# Patient Record
Sex: Female | Born: 1975 | Race: White | Hispanic: No | Marital: Married | Smoking: Former smoker
Health system: Southern US, Community
[De-identification: ages and names within clinical notes are randomized; demographics above are authoritative.]

## PROBLEM LIST (undated history)

## (undated) ENCOUNTER — Inpatient Hospital Stay (HOSPITAL_COMMUNITY): Payer: Self-pay

## (undated) DIAGNOSIS — D649 Anemia, unspecified: Secondary | ICD-10-CM

## (undated) DIAGNOSIS — G5720 Lesion of femoral nerve, unspecified lower limb: Secondary | ICD-10-CM

## (undated) DIAGNOSIS — B009 Herpesviral infection, unspecified: Secondary | ICD-10-CM

## (undated) DIAGNOSIS — R112 Nausea with vomiting, unspecified: Secondary | ICD-10-CM

## (undated) DIAGNOSIS — F419 Anxiety disorder, unspecified: Secondary | ICD-10-CM

## (undated) DIAGNOSIS — Z8742 Personal history of other diseases of the female genital tract: Secondary | ICD-10-CM

## (undated) DIAGNOSIS — N809 Endometriosis, unspecified: Secondary | ICD-10-CM

## (undated) DIAGNOSIS — IMO0002 Reserved for concepts with insufficient information to code with codable children: Secondary | ICD-10-CM

## (undated) DIAGNOSIS — Z9889 Other specified postprocedural states: Secondary | ICD-10-CM

## (undated) DIAGNOSIS — O039 Complete or unspecified spontaneous abortion without complication: Secondary | ICD-10-CM

## (undated) DIAGNOSIS — R87619 Unspecified abnormal cytological findings in specimens from cervix uteri: Secondary | ICD-10-CM

## (undated) HISTORY — DX: Herpesviral infection, unspecified: B00.9

## (undated) HISTORY — DX: Lesion of femoral nerve, unspecified lower limb: G57.20

## (undated) HISTORY — PX: WISDOM TOOTH EXTRACTION: SHX21

## (undated) HISTORY — DX: Personal history of other diseases of the female genital tract: Z87.42

## (undated) HISTORY — DX: Anemia, unspecified: D64.9

## (undated) HISTORY — DX: Complete or unspecified spontaneous abortion without complication: O03.9

## (undated) HISTORY — DX: Endometriosis, unspecified: N80.9

## (undated) HISTORY — PX: FOOT SURGERY: SHX648

## (undated) HISTORY — DX: Anxiety disorder, unspecified: F41.9

---

## 2001-09-25 ENCOUNTER — Other Ambulatory Visit: Admission: RE | Admit: 2001-09-25 | Discharge: 2001-09-25 | Payer: Self-pay | Admitting: Family Medicine

## 2002-06-25 HISTORY — PX: LAPAROSCOPY: SHX197

## 2004-07-04 ENCOUNTER — Ambulatory Visit (HOSPITAL_COMMUNITY): Admission: AD | Admit: 2004-07-04 | Discharge: 2004-07-04 | Payer: Self-pay | Admitting: Gynecology

## 2004-07-04 ENCOUNTER — Encounter (INDEPENDENT_AMBULATORY_CARE_PROVIDER_SITE_OTHER): Payer: Self-pay | Admitting: *Deleted

## 2005-06-25 HISTORY — PX: DILATION AND CURETTAGE OF UTERUS: SHX78

## 2006-09-16 ENCOUNTER — Encounter (INDEPENDENT_AMBULATORY_CARE_PROVIDER_SITE_OTHER): Payer: Self-pay | Admitting: Gynecology

## 2006-09-16 ENCOUNTER — Ambulatory Visit: Payer: Self-pay | Admitting: Gynecology

## 2007-04-16 ENCOUNTER — Ambulatory Visit: Payer: Self-pay | Admitting: Gynecology

## 2007-04-18 ENCOUNTER — Ambulatory Visit (HOSPITAL_COMMUNITY): Admission: RE | Admit: 2007-04-18 | Discharge: 2007-04-18 | Payer: Self-pay | Admitting: Gynecology

## 2007-05-05 ENCOUNTER — Ambulatory Visit: Payer: Self-pay | Admitting: Gynecology

## 2007-05-05 ENCOUNTER — Encounter (INDEPENDENT_AMBULATORY_CARE_PROVIDER_SITE_OTHER): Payer: Self-pay | Admitting: Gynecology

## 2007-05-28 ENCOUNTER — Ambulatory Visit (HOSPITAL_COMMUNITY): Admission: RE | Admit: 2007-05-28 | Discharge: 2007-05-28 | Payer: Self-pay | Admitting: Gynecology

## 2007-06-02 ENCOUNTER — Ambulatory Visit: Payer: Self-pay | Admitting: Gynecology

## 2007-06-26 DIAGNOSIS — G5720 Lesion of femoral nerve, unspecified lower limb: Secondary | ICD-10-CM

## 2007-06-26 HISTORY — DX: Lesion of femoral nerve, unspecified lower limb: G57.20

## 2007-07-02 ENCOUNTER — Ambulatory Visit (HOSPITAL_COMMUNITY): Admission: RE | Admit: 2007-07-02 | Discharge: 2007-07-02 | Payer: Self-pay | Admitting: Gynecology

## 2007-07-07 ENCOUNTER — Ambulatory Visit: Payer: Self-pay | Admitting: Gynecology

## 2007-07-21 ENCOUNTER — Ambulatory Visit (HOSPITAL_COMMUNITY): Admission: RE | Admit: 2007-07-21 | Discharge: 2007-07-21 | Payer: Self-pay | Admitting: Gynecology

## 2007-07-25 ENCOUNTER — Ambulatory Visit: Payer: Self-pay | Admitting: Gynecology

## 2007-08-11 ENCOUNTER — Ambulatory Visit: Payer: Self-pay | Admitting: Gynecology

## 2007-08-28 DIAGNOSIS — D239 Other benign neoplasm of skin, unspecified: Secondary | ICD-10-CM

## 2007-08-28 HISTORY — DX: Other benign neoplasm of skin, unspecified: D23.9

## 2007-09-08 ENCOUNTER — Ambulatory Visit: Payer: Self-pay | Admitting: Family Medicine

## 2007-09-25 ENCOUNTER — Ambulatory Visit: Payer: Self-pay | Admitting: Family Medicine

## 2007-10-06 ENCOUNTER — Ambulatory Visit: Payer: Self-pay | Admitting: Gynecology

## 2007-10-07 ENCOUNTER — Ambulatory Visit (HOSPITAL_COMMUNITY): Admission: RE | Admit: 2007-10-07 | Discharge: 2007-10-07 | Payer: Self-pay | Admitting: Gynecology

## 2007-10-13 ENCOUNTER — Ambulatory Visit: Payer: Self-pay | Admitting: Gynecology

## 2007-10-27 ENCOUNTER — Ambulatory Visit: Payer: Self-pay | Admitting: Gynecology

## 2007-11-10 ENCOUNTER — Ambulatory Visit: Payer: Self-pay | Admitting: Gynecology

## 2007-11-25 ENCOUNTER — Ambulatory Visit: Payer: Self-pay | Admitting: Family Medicine

## 2007-12-01 ENCOUNTER — Ambulatory Visit: Payer: Self-pay | Admitting: Gynecology

## 2007-12-08 ENCOUNTER — Ambulatory Visit: Payer: Self-pay | Admitting: Gynecology

## 2007-12-16 ENCOUNTER — Ambulatory Visit: Payer: Self-pay | Admitting: Family Medicine

## 2007-12-24 ENCOUNTER — Ambulatory Visit: Payer: Self-pay | Admitting: Gynecology

## 2007-12-25 ENCOUNTER — Ambulatory Visit: Payer: Self-pay | Admitting: Obstetrics & Gynecology

## 2007-12-25 ENCOUNTER — Inpatient Hospital Stay (HOSPITAL_COMMUNITY): Admission: AD | Admit: 2007-12-25 | Discharge: 2007-12-29 | Payer: Self-pay | Admitting: Obstetrics & Gynecology

## 2008-02-16 ENCOUNTER — Ambulatory Visit: Payer: Self-pay | Admitting: Obstetrics & Gynecology

## 2008-05-10 ENCOUNTER — Ambulatory Visit: Payer: Self-pay | Admitting: Gynecology

## 2008-05-10 ENCOUNTER — Encounter: Payer: Self-pay | Admitting: Obstetrics and Gynecology

## 2009-10-11 ENCOUNTER — Encounter: Payer: Self-pay | Admitting: Obstetrics & Gynecology

## 2009-10-11 ENCOUNTER — Ambulatory Visit: Payer: Self-pay | Admitting: Nurse Practitioner

## 2009-10-11 LAB — CONVERTED CEMR LAB
ALT: 17 units/L (ref 0–35)
AST: 14 units/L (ref 0–37)
Albumin: 4.5 g/dL (ref 3.5–5.2)
Alkaline Phosphatase: 72 units/L (ref 39–117)
Creatinine, Ser: 0.71 mg/dL (ref 0.40–1.20)
Glucose, Bld: 71 mg/dL (ref 70–99)
HCT: 40.8 % (ref 36.0–46.0)
MCHC: 33.1 g/dL (ref 30.0–36.0)
MCV: 91.5 fL (ref 78.0–100.0)
Platelets: 235 10*3/uL (ref 150–400)
RBC: 4.46 M/uL (ref 3.87–5.11)
RDW: 13.6 % (ref 11.5–15.5)
Total Bilirubin: 0.8 mg/dL (ref 0.3–1.2)
Total Protein: 6.9 g/dL (ref 6.0–8.3)

## 2010-11-07 NOTE — Assessment & Plan Note (Signed)
NAME:  BEAUTY, PLESS NO.:  [id number]   MEDICAL RECORD NO.:  [id number]          PATIENT TYPE:  POB   LOCATION:  [facility] at [facility]         FACILITY:  [facility]   PHYSICIAN:  Jaynie Collins, MD     DATE OF BIRTH:  January 04, 1976   DATE OF SERVICE:                                  CLINIC NOTE   The patient comes to office today for her yearly physical and Pap smear.  The patient's last Pap was in November of 2009, it was negative.  The  patient has had negatives in 2008.  She is currently not using any birth  control.  She and her husband are, at this point, not trying to prevent  pregnancy.  She does have a history of endometriosis and continues to  have some problems with pelvic pain with intercourse.  It does help if  she positions herself using pillows.  She is aware that most of the  endometriosis treatments would prevent her from having a family.  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  She still has a very small amount of weakness  in her right thigh mostly when she is in a squatting position and tries  to stand.  She takes Xanax 1 time a week, sometimes 2 times a week,  especially if she is traveling.  She uses her Valtrex on an as-needed  basis for fever, blisters on her lips.   PHYSICAL EXAMINATION:  VITAL SIGNS:  Blood pressure is 103/74, pulse is  68, weight is 133, height is 5 feet 5 inches.  GENERAL:  Well-developed, well-nourished, 35 year old Caucasian female,  in no acute distress.  HEENT:  Head is normocephalic and atraumatic.  Pupils equal and  reactive.  Thyroid is not enlarged.  There are no nodules present.  CARDIAC:  Regular rate and rhythm.  LUNGS:  Clear bilaterally.  BREASTS:  Symmetrical.  There is no skin dimpling.  There are no masses  appreciated.  ABDOMEN:  Soft, nontender.  No organomegaly.  GENITALIA:  Externally, there are no lesions or discharges.  Internally,  vaginal vault has  small amount of white, nonodorous discharge.  Cervix  is slightly eroded around the os.  Bimanual exam; no cervical motion  tenderness.  No adnexal mass.  EXTREMITIES:  Warm and dry with good pulses.   ASSESSMENT:  1. Yearly exam.  2. Herpes simplex virus 1.  3. Anxiety.  4. Hemorrhoids.   PLAN:  The patient is asked to start prenatal vitamins of 1 p.o. daily.  She would prefer to purchase these over-the-counter.  Today, we will  check her TSH, CBC, CMET.  She will be given a prescription for Valtrex  500 mg 1 p.o. up to 2 g for 2 days for fever blisters #30 with p.r.n.  refills.  She will be given a prescription for her Xanax 0.5 mg 1 p.o.  p.r.n. anxiety with no refills.  The patient, as we were leaving room,  is asking for a refill on her hemorrhoid  cream.  She has been given Anusol-HC 2.5% to apply to the area b.i.d.  p.r.n.  She will be  given 1-2 with p.r.n. refills.  The patient will  return to the office in 1 year or sooner if needed.      Remonia Richter, NP    ______________________________  Jaynie Collins, MD    LR/MEDQ  D:  10/11/2009  T:  10/12/2009  Job:  [id number]

## 2010-11-07 NOTE — Assessment & Plan Note (Signed)
NAME:  Whitney Contreras, Whitney Contreras NO.:  [id number]   MEDICAL RECORD NO.:  [id number]          PATIENT TYPE:  POB   LOCATION:  [facility] at [facility]         FACILITY:  [facility]   PHYSICIAN:  Argentina Donovan, MD        DATE OF BIRTH:  September 07, 1975   DATE OF SERVICE:  05/10/2008                                  CLINIC NOTE   The patient is a 35 year old Caucasian female who delivered a baby in  July 2009, at her 6-week checkup at the end of August was given  Seasonique samples for 3 months, which she has not started yet.  She is  not nursing at this point.  Postpartum, she had a right femoral  neuropathy which seems to be pretty much back to normal.  She has no  other complaints and occasionally gets outbreaks of fever blisters on  her lip and wanted a renewal of Valtrex for that.   REVIEW OF SYSTEMS:  Negative with the exception of that particular  complaint and the femoral nerve problem.   PHYSICAL EXAMINATION:  VITAL SIGNS:  She is 5 feet 4 inches, weighs 153  pounds.  Her blood pressure is 117/89, pulse is 90 per minute.  HEENT:  Within normal limits.  NECK:  Supple and symmetrical, thyroid with no masses.  BACK:  Erect.  BREASTS:  Symmetrical with no dominant masses.  No nipple discharge.  LUNGS:  Clear to auscultation and percussion.  HEART:  No murmur.  Normal sinus rhythm.  ABDOMEN:  Soft, flat, and nontender.  No masses or organomegaly.  NEUROLOGIC:  DTRs are within normal limits in all extremities.  In the  lower extremities, Babinski is negative, and she states there is no loss  of sensitivity.  GENITALIA:  External is normal.  BUS is within normal limits.  Vagina is  clean and well rugated.  Cervix is clean and parous.  Pap smear was  taken.  The uterus is anterior, normal size, shape, and consistency.  The adnexa is normal.  Cul-de-sac is free.   IMPRESSION:  Normal physical examination.   PLAN:  Have the patient start on her Seasonique; if she likes it, we  will call  in renewals when she is about to finish her first pack, and I  have given her a year's prescription for Valtrex 500.           ______________________________  Argentina Donovan, MD     PR/MEDQ  D:  05/10/2008  T:  05/11/2008  Job:  [id number]

## 2010-11-07 NOTE — Consult Note (Signed)
NAME:  Whitney Contreras NO.:  [id number]   MEDICAL RECORD NO.:  [id number]          PATIENT TYPE:  INP   LOCATION:  9120                          FACILITY:  [facility]   PHYSICIAN:  Deanna Artis. Hickling, M.D.DATE OF BIRTH:  19-Sep-1975   DATE OF CONSULTATION:  12/28/2007  DATE OF DISCHARGE:                                 CONSULTATION   CHIEF COMPLAINT:  Right femoral nerve injury.   HISTORY OF PRESENT CONDITION:  I was asked to see Whitney Contreras who is  a 35 year old gravida 1, para 1 woman who delivered vaginally on July 3  around 3:30 p.m.   The patient had a prolonged labor with stage II pushing for greater than  3 hours.  The patient did not show significant decelerations.  Contractions were every 2-3 minutes.  Mother was on Pitocin.  A decision  was made to offer vacuum extraction versus cesarean section, family  chose the former.  Mother had epidural anesthesia.  The patient was  delivered after 30 minutes of attempts with one pop-off.  Apgars were  9 and 9, and the child's weight was 8 pounds 6 ounces.   In the aftermath, as the patient's epidural were off, she experienced  persistent weakness in the right leg and every time she tried to stand,  it buckled and fell away.  She also had significant numbness.   This was evaluated and seemed to be improving over July 4 and so  observation was continued.  This morning, the patient continued to have  weakness in her leg and I was asked to see the patient to determine the  etiology of her weakness and make recommendations for further workup and  treatment.  The tentative diagnosis was a right femoral neuropathy.  It  was thought that it might stem from some form of complication with the  epidural.   PAST MEDICAL HISTORY:  The patient has been a healthy woman.  She had an  unremarkable gestation.  I have described her labor and delivery.   CURRENT MEDICATIONS:  Prenatal vitamins, Senokot, acetaminophen,  Percocet,  ibuprofen, Darvocet, Ambien, Zofran, and all p.r.n.  medications.   DRUG ALLERGIES:  None known.   PAST MEDICAL HISTORY:  No serious illnesses or injuries.   PAST SURGICAL HISTORY:  None.   FAMILY HISTORY:  Noncontributory for this problem.   SOCIAL HISTORY:  The patient is married.  This is her first child.  She  is at bedside with her husband the other family members.   PHYSICAL EXAMINATION:  GENERAL:  Today, pleasant woman in no acute  distress.  VITAL SIGNS:  Temperature 98.0, blood pressure 111/67, resting pulse 81,  respirations 20, oxygen saturation 99%.  EARS, NOSE, AND THROAT:  No signs of infection.  No bruits.  NECK:  Supple neck.  No infection.  LUNGS:  Clear.  HEART:  No murmurs.  Pulses normal.  ABDOMEN:  Soft.  Bowel sounds normal.  No hepatosplenomegaly.  EXTREMITIES:  Unremarkable.  The patient had no atrophy, bruising, or  pain or cords palpated in her legs.  NEUROLOGIC:  Awake, alert without  dysphasia, dyspraxia, or dysarthria.  Cranial nerves:  Round and reactive pupils.  Fundi normal.  Visual  fields full to double simultaneous stimuli.  Symmetric facial strength.  Midline tongue and uvula.  Air conduction greater than bone conduction  bilaterally.   Motor examination:  Normal strength.  Fine motor movements normal.  No  drift.  The only weakness she has, knee extensors are 2/5 on the right,  knee flexors are 4/5.  The right psoas, hip abductor, knee flexor, foot  dorsiflexor, foot plantar flexor, foot inverter, foot everter, and  extensor hallus longus are all normal.  Left side is entirely normal as  is the right arm.   Sensory examination shows a mild hypesthesia in the medial aspect of the  right thigh.  She senses pinprick and cold.  She has good vibratory and  proprioceptive sense and good stereognosis.  Cerebellar examination:  Good finger-to-nose, rapid alternating movements, heel-knee-shin was  acceptable, particularly given the weakness in her  right leg.  Gait was  not tested.  Deep tendon reflexes were normal in the upper extremities,  brisk at the left knee, absent in the right knee, ankles were  diminished.  The patient had bilateral flexor plantar responses.   IMPRESSION:  Right femoral neuropathy. (956.1) I believe this is a  stretch injury from labor and delivery with the patient in the lithotomy  position and vacuum extraction.  I do not think this had anything to do  with the epidural.   PLAN:  1. Physical therapy as an outpatient will help her regain mechanical      event and she has already begun to understand that if she keeps her      knee locked, that she can ambulate but as soon as she bends that      she falls.  This could be done at [facility], [address]  2. Followup with my nurse practitioner, Theressa Millard in my office      in 1 month's time for reassessment.  3. We may consider nerve conductions and EMGs at that time, but there      is no reason to do so now because they would not show      abnormalities.  4. The patient has recovered from 0/5-2/5, this is a good sign but the      duration of her recovery may be weeks.  I expect a full recovery.  5. I discussed safety in the home in particular, because there are 3      steps into the home and a 2-storey building that they live in, she      needs to stay on the ground floor and if she goes up stairs, she      needs her father both going up and down so that she does not fall.  6. Okay to discharge home today.  I have given the family my card, so      that they can contact me should there be further problems.  I      appreciate the opportunity for taking her care.      Deanna Artis. Sharene Skeans, M.D.  Electronically Signed     [facility]/MEDQ  D:  12/28/2007  T:  12/28/2007  Job:  [id number]   cc:   Allie Bossier, MD   Adron Bene., M.D.  Fax: [phone]

## 2010-11-07 NOTE — Op Note (Signed)
NAME:  Whitney Contreras, Whitney Contreras NO.:  [id number]   MEDICAL RECORD NO.:  [id number]          PATIENT TYPE:  POB   LOCATION:  [facility]                          FACILITY:  [facility]   PHYSICIAN:  Norton Blizzard, MD    DATE OF BIRTH:  April 10, 1976   DATE OF PROCEDURE:  12/26/2007  DATE OF DISCHARGE:                               OPERATIVE REPORT   PREOPERATIVE DIAGNOSES:  Intrauterine pregnancy at 41 weeks, prolonged  second stage of labor.   POSTOPERATIVE DIAGNOSES:  Intrauterine pregnancy at 41 weeks, prolonged  second stage of labor.   PROCEDURES:  1. Vacuum-assisted vaginal delivery.  2. Manual extraction of placenta.  3. Midline episiotomy.   SURGEON:  Norton Blizzard, MD   ASSISTANT:  Sid Falcon, Certified Nurse Midwife.   ANESTHESIA:  Epidural.   ESTIMATED BLOOD LOSS:  300 mL.   COMPLICATIONS:  None.   INDICATIONS AND PROCEDURE DETAILS:  The patient is a 35 year old G2, P-0-  0-1-0, who presented at 41 weeks after spontaneous rupture of membranes  and active labor.  The patient progressed to full dilatation but fetal  head was noted to be at a 0 station.  The patient was allowed to labor  down for 2 hours.  During this time, the fetal heart rate tracing was  reactive and reassuring.  Around 10 a.m., the patient started pushing  and was pushing for over 3 hours.  At this time, she was examined and  found to be in +2 station.  Given her prolonged second stage of labor,  the patient was offered operative vaginal delivery versus cesarean  section.  The risks of both procedures were explained to the patient;  for cesarean section the risk of bleeding, infection, injury to  surrounding organs, and need for additional procedures; for the vacuum,  risk of cephalohematoma which was quoted as 1 in 860 cases, anal  sphincter injury, and need for cesarean section in the case of failure.  All questions were answered, and the patient opted to go with a vacuum  extraction.   Written informed consent was obtained.  Arrangements were  made for the vacuum extraction, and the patient continued to push.  At  the time of vacuum extraction, the fetal head was noted to be in ROP  presentation with the fetal head being at +2 station.  The vacuum used  was a Kiwi, and the cup of the Kiwi was placed 3 cm in front of the  posterior fontanelle.  The suction pressure was then increased to 600  mmHg during contractions and traction was applied to the handle of the  Kiwi to aid in delivery.  Of note, this was a difficult vaginal vacuum  extraction.  The duration of procedure was about 30 minutes with one  popoff of the Kiwi.  The infant finally delivered in ROT presentation  with significant caput.  Viable female infant, Apgars were 9 and 9, Weight  8 pounds 6 ounces.  There was a midline episiotomy that was performed,  and there was no extension of this episiotomy noted.  The episiotomy  just ended  up being a second-degree laceration, which was repaired with  3-0 Vicryl in an usual fashion.  Estimated blood loss from the  procedures was 300 mL as previously noted.  Vigorous  fundal massage and Pitocin was administered, and placenta was still  undelivered after 30 minutes, so manual extraction was performed.  The  placenta was removed in its entirety as confirmed on subsequent bimanual  examination.  The patient tolerated all the procedures well, and was in  stable condition.      Norton Blizzard, MD  Electronically Signed     UAD/MEDQ  D:  12/26/2007  T:  12/27/2007  Job:  [id number]

## 2010-11-10 NOTE — Discharge Summary (Signed)
NAME:  Whitney Contreras, Whitney Contreras NO.:  [id number]   MEDICAL RECORD NO.:  [id number]          PATIENT TYPE:  INP   LOCATION:  9120                          [facility]ACILITY[facility]   PHYSICIAN:  Norton Blizzard, MD    DATE OF BIRTH:  1975-08-24   DATE OF ADMISSION:  12/25/2007  DATE OF DISCHARGE:  12/29/2007                               DISCHARGE SUMMARY   DISCHARGE DIAGNOSES:  1. Status post vacuum-assisted vaginal delivery.  2. Intrauterine pregnancy at 41 weeks.  3. Right femoral neuropathy.   PROCEDURE:  Vacuum-assisted vaginal delivery on December 26, 2007.   HOSPITAL COURSE:  The patient is a 35 year old G2, P 0-0-1-0 at [redacted] weeks  gestational age, who presented with spontaneous rupture of membranes in  an active labor.  The patient was pushing first 3 hours and was given  choice of vacuum-assisted vaginal delivery versus C-section with risk  and benefits of each procedure explained.  The patient decided on vacuum-  assisted vaginal delivery.  The patient delivered a viable female with  Apgars 9 and 9, also had episiotomy repair with 3-0 Vicryl, and manual  extraction of placenta, with intact three-vessel cord.  Estimated blood  loss of 300 mL.  Postpartum, the patient was seen for right quadriceps  weakness, and Neurology was consulted as well as Physical Therapy.  Neurologist's impression of right quadriceps weakness was right femoral  neuropathy due to a stretch injury from labor and delivery with the  patient in the lithotomy position .  Patient is to have physical therapy  as an outpatient and follow up in 1 month with Neurology.  The patient  was stable upon discharge.  Discharged home with crutches and assistance  of rolling walker.  The patient was breast-feeding and using Micronor  for contraception.   ADMISSION LABS:  GBS negative.  Blood type B positive.  Antibody  negative.  Rubella immune.  Hemoglobin surface antigen negative.  RPR  nonreactive.  GC chlamydia negative.   HIV nonreactive.  Hemoglobin on  12/27/2007 was 9.6.   DISCHARGE MEDICATIONS:  1. Ibuprofen 600 mg 1 tablet p.o. q.6 h. p.r.n. pain.  2. Prenatal vitamins 1 tablet p.o. daily.  3. Micronor 1 tablet daily, use as directed.   DISCHARGE INSTRUCTIONS:  The patient is to have pelvic rest x6 weeks.  No heavy lifting x6 weeks.  Use assistance of crutches or rolling walker  on the first floor only.  Assistance with baby at home.   FOLLOW UP:  The patient is to follow up at [facility] in 6  weeks for a postpartum visit.  Follow up in 1 month at the Neurology  Clinic attending, Dr. Sharene Skeans.      Milinda Antis, MD  Electronically Signed     ______________________________  Norton Blizzard, MD    KD/MEDQ  D:  01/13/2008  T:  01/14/2008  Job:  [id number]

## 2010-11-10 NOTE — Op Note (Signed)
NAME:  SOLACE, MANWARREN NO.:  [id number]   MEDICAL RECORD NO.:  [id number]          PATIENT TYPE:  AMB   LOCATION:  [facility]                           [facility]ILITY[facility]   PHYSICIAN:  Ginger Carne, MD  DATE OF BIRTH:  1976/05/01   DATE OF PROCEDURE:  07/04/2004  DATE OF DISCHARGE:                                 OPERATIVE REPORT   PREOPERATIVE DIAGNOSIS:  First trimester missed abortion.   POSTOPERATIVE DIAGNOSIS:  First trimester missed abortion.   PROCEDURE:  Aspiration, dilation, curettage.   SURGEON:  Dr. Mia Creek   ASSISTANT:  None.   COMPLICATIONS:  None immediate.   ESTIMATED BLOOD LOSS:  Negligible.   SPECIMENS:  Products of conception.   ANESTHESIA:  MAC with Marcaine with epinephrine for local injection.   OPERATIVE FINDINGS:  External genitalia, vulva, and vagina are normal.  Cervix without erosions or lesions.  Uterus sounded to 8 cm.  A #7 suction  curette utilized.  Products of conception noted.   OPERATIVE PROCEDURE:  The patient prepped and draped in the usual fashion  and placed in the lithotomy position.  Betadine surgical solution used for  antiseptic, and the patient was catheterized prior to the procedure.  After  adequate MAC analgesia with Marcaine with epinephrine utilized as a  paracervical block, dilatation to accommodate #7 suction cannula was  followed by suction and sharp curettage.  Minimal bleeding noted at the end  of the procedure.  The patient returned to the postanesthesia recovery room  in excellent condition.     Stev   SHB/MEDQ  D:  07/04/2004  T:  07/04/2004  Job:  [id number]

## 2011-03-08 ENCOUNTER — Ambulatory Visit (INDEPENDENT_AMBULATORY_CARE_PROVIDER_SITE_OTHER): Payer: BC Managed Care – PPO | Admitting: *Deleted

## 2011-03-08 VITALS — BP 122/81

## 2011-03-08 DIAGNOSIS — Z3480 Encounter for supervision of other normal pregnancy, unspecified trimester: Secondary | ICD-10-CM

## 2011-03-08 NOTE — Progress Notes (Signed)
Patient has stopped smoking since positive pregnancy test.  She is definitely interested in genetic counseling and first trimester screening.  Blood work done today for routine prenatals.

## 2011-03-09 LAB — OBSTETRIC PANEL
Basophils Absolute: 0 10*3/uL (ref 0.0–0.1)
Basophils Relative: 0 % (ref 0–1)
Eosinophils Relative: 0 % (ref 0–5)
HCT: 40.6 % (ref 36.0–46.0)
Hemoglobin: 13.4 g/dL (ref 12.0–15.0)
Monocytes Absolute: 0.3 10*3/uL (ref 0.1–1.0)
Neutrophils Relative %: 78 % — ABNORMAL HIGH (ref 43–77)
RBC: 4.42 MIL/uL (ref 3.87–5.11)
RDW: 13.2 % (ref 11.5–15.5)
Rh Type: POSITIVE
WBC: 9.5 10*3/uL (ref 4.0–10.5)

## 2011-03-09 LAB — HIV ANTIBODY (ROUTINE TESTING W REFLEX): HIV: NONREACTIVE

## 2011-03-10 LAB — CULTURE, URINE COMPREHENSIVE
Colony Count: NO GROWTH
Organism ID, Bacteria: NO GROWTH

## 2011-03-22 ENCOUNTER — Other Ambulatory Visit: Payer: BC Managed Care – PPO | Admitting: *Deleted

## 2011-03-22 LAB — CBC
HCT: 37.7
Hemoglobin: 13.1
MCV: 88.2
Platelets: 232
Platelets: 305
RBC: 3.02 — ABNORMAL LOW
RDW: 14
WBC: 14.9 — ABNORMAL HIGH

## 2011-03-22 LAB — RPR: RPR Ser Ql: NONREACTIVE

## 2011-03-22 NOTE — Progress Notes (Signed)
Helmut Muster comes today to confirm fetal heart rate.  She says that for the past week she has been cramping and not feeling "right"  For about one week now.  Bedside ultrasound shows the fetus at 6wk 6day measurements and no fetal heart rate is located.  According to her period date she should be 7wks 5days.  Patient wishes to give it more time, she will come to office if there are any changes in her symptoms, bleeding severe cramping, fever.  She is scheduled to go out of town through next week and knows that she can go to ER at [facility] if her symptoms worsen.  She will follow up with [place] when she returns.  All options were explained to patient and she will talk to her husband and let us know when she returns if things have not resolved.  She will also get blood work done when she returns for [employer].  Patients cramping right now is mild and low central.

## 2011-04-02 ENCOUNTER — Other Ambulatory Visit (INDEPENDENT_AMBULATORY_CARE_PROVIDER_SITE_OTHER): Payer: BC Managed Care – PPO | Admitting: *Deleted

## 2011-04-02 DIAGNOSIS — O200 Threatened abortion: Secondary | ICD-10-CM

## 2011-04-02 NOTE — Progress Notes (Signed)
Patient arrives today for repeat quant levels to be sure of what we saw at her last OB nurse visit.  She had to leave for vacation and was not able to be seen by a physician until this week.  She has had some cramping, but no bleeding at all.    Today on bedside ultrasound her gestational sac is measuring six weeks still.  There is no fetal heart rate present.  It also appears that her Left ovary is enlarged at 47.35mm by 41.28mm.  I advised patient that we needed to get an official ultrasound done at [facility] hospital to be sure of what was going on.  She agrees and this was scheduled for tomorrow at [facility] hospital at 11:00.  She will follow up with the physician Monday to go over all of these results and findings.  She understands the options available to her if this is not a viable pregnancy.  Right now she wishes to wait and see if things will happen naturally.

## 2011-04-03 ENCOUNTER — Other Ambulatory Visit: Payer: Self-pay | Admitting: Family Medicine

## 2011-04-03 ENCOUNTER — Ambulatory Visit (HOSPITAL_COMMUNITY)
Admission: RE | Admit: 2011-04-03 | Discharge: 2011-04-03 | Disposition: A | Discharge status: Home / Self Care | Payer: BC Managed Care – PPO | Source: Ambulatory Visit | Attending: Family Medicine | Admitting: Family Medicine

## 2011-04-03 DIAGNOSIS — O021 Missed abortion: Secondary | ICD-10-CM | POA: Insufficient documentation

## 2011-04-09 ENCOUNTER — Ambulatory Visit (INDEPENDENT_AMBULATORY_CARE_PROVIDER_SITE_OTHER): Payer: BC Managed Care – PPO | Admitting: Obstetrics & Gynecology

## 2011-04-09 ENCOUNTER — Encounter: Payer: Self-pay | Admitting: Obstetrics & Gynecology

## 2011-04-09 VITALS — BP 104/64 | HR 76 | Wt 142.0 lb

## 2011-04-09 DIAGNOSIS — N83209 Unspecified ovarian cyst, unspecified side: Secondary | ICD-10-CM

## 2011-04-09 NOTE — Progress Notes (Signed)
  Subjective:    Patient ID: Whitney Contreras, female    DOB: [birth year], 35 y.o.   MRN: [id number]  HPI  She is a 35 yo MW G3P1A2 who had a fetus seen on U/s but no heart beat.  She started having some moderate VB "like a period" about 3 days ago.  No intense pain.  U/s at [facility] showed either a Right ovarian cyst or an endometrioma.  Review of Systems    she has a h/o endometriosis Objective:   Physical Exam        Assessment & Plan:  Miscarriage in process. A thorough discussion of options including watchful waiting versus d&c.  She has been given precautions and prefers w.w.  I will order a f/u u/s to assess ovarian cyst in 6 weeks.

## 2011-05-02 ENCOUNTER — Telehealth: Payer: Self-pay

## 2011-05-02 NOTE — Telephone Encounter (Signed)
Patient is requesting a refill of xanax .5mg  .  I authorized refill for patient.

## 2011-05-02 NOTE — Telephone Encounter (Signed)
Patient needs a refill on her Xanax, please call it in to her pharmacy [facility].

## 2011-10-08 ENCOUNTER — Telehealth: Payer: Self-pay

## 2011-10-08 NOTE — Telephone Encounter (Signed)
PATIENT CALLED ASKING FOR REFILLS ON HER XANAX AND VALTREX GENERIC. PER TINA I CALLED IN 1 MONTH REFILLS ON BOTH TO [facility]

## 2012-03-28 ENCOUNTER — Ambulatory Visit (INDEPENDENT_AMBULATORY_CARE_PROVIDER_SITE_OTHER): Payer: BC Managed Care – PPO | Admitting: Family Medicine

## 2012-03-28 ENCOUNTER — Encounter: Payer: Self-pay | Admitting: Family Medicine

## 2012-03-28 VITALS — BP 119/78 | HR 86 | Ht 64.0 in | Wt 142.0 lb

## 2012-03-28 DIAGNOSIS — F419 Anxiety disorder, unspecified: Secondary | ICD-10-CM

## 2012-03-28 DIAGNOSIS — Z124 Encounter for screening for malignant neoplasm of cervix: Secondary | ICD-10-CM

## 2012-03-28 DIAGNOSIS — B001 Herpesviral vesicular dermatitis: Secondary | ICD-10-CM

## 2012-03-28 DIAGNOSIS — Z01419 Encounter for gynecological examination (general) (routine) without abnormal findings: Secondary | ICD-10-CM

## 2012-03-28 DIAGNOSIS — B009 Herpesviral infection, unspecified: Secondary | ICD-10-CM

## 2012-03-28 DIAGNOSIS — F411 Generalized anxiety disorder: Secondary | ICD-10-CM

## 2012-03-28 DIAGNOSIS — Z1151 Encounter for screening for human papillomavirus (HPV): Secondary | ICD-10-CM

## 2012-03-28 MED ORDER — ALPRAZOLAM 0.5 MG PO TABS: 0.5000 mg | ORAL_TABLET | Freq: Every evening | ORAL | Status: DC | PRN

## 2012-03-28 MED ORDER — VALACYCLOVIR HCL 500 MG PO TABS: 500.0000 mg | ORAL_TABLET | Freq: Two times a day (BID) | ORAL | Status: DC | PRN

## 2012-03-28 NOTE — Progress Notes (Signed)
  Subjective:     Whitney Contreras is a 36 y.o. female and is here for a comprehensive physical exam. The patient reports no problems. She has been actively trying to achieve pregnancy. They been trying for approximately 4 months. She has not been routinely taking  prenatal vitamins.  History   Social History  . Marital Status: Married    Spouse Name: N/A    Number of Children: N/A  . Years of Education: N/A   Occupational History  . Not on file.   Social History Main Topics  . Smoking status: Former Games developer  . Smokeless tobacco: Never Used  . Alcohol Use: No  . Drug Use: No  . Sexually Active: Yes -- Female partner(s)   Other Topics Concern  . Not on file   Social History Narrative  . No narrative on file   Health Maintenance  Topic Date Due  . Pap Smear  05/08/1993  . Tetanus/tdap  05/08/1994  . Influenza Vaccine  02/24/2012    The following portions of the patient's history were reviewed and updated as appropriate: allergies, current medications, past family history, past medical history, past social history, past surgical history and problem list.  Review of Systems A comprehensive review of systems was negative.  except for dyspareunia.  Objective:    BP 119/78  Pulse 86  Ht 5\' 4"  (1.626 m)  Wt 142 lb (64.411 kg)  BMI 24.37 kg/m2  LMP 03/07/2012 General appearance: alert, cooperative and appears stated age Head: Normocephalic, without obvious abnormality, atraumatic Neck: supple, symmetrical, trachea midline and thyroid not enlarged, symmetric, no tenderness/mass/nodules Lungs: clear to auscultation bilaterally Breasts: normal appearance, no masses or tenderness Heart: regular rate and rhythm, S1, S2 normal, no murmur, click, rub or gallop Abdomen: soft, non-tender; bowel sounds normal; no masses,  no organomegaly Pelvic: cervix normal in appearance, external genitalia normal, no adnexal masses or tenderness, no cervical motion tenderness, uterus normal size,  shape, and consistency and vagina normal without discharge Extremities: extremities normal, atraumatic, no cyanosis or edema Pulses: 2+ and symmetric Skin: Skin color, texture, turgor normal. No rashes or lesions Lymph nodes: Cervical, supraclavicular, and axillary nodes normal. Neurologic: Grossly normal    Assessment:    Healthy female exam.  Desired fertility History of Endometriosis     Plan:  Pap smear today Preconception counseling, timing of intercourse discussed, advanced maternal age and risk of Down syndrome and other chromosome anomalies discussed. Potential need for definitive treatment for endometriosis also discussed.   See After Visit Summary for Counseling Recommendations

## 2012-03-28 NOTE — Patient Instructions (Signed)
Preventive Care for Adults, Female A healthy lifestyle and preventive care can promote health and wellness. Preventive health guidelines for women include the following key practices.  A routine yearly physical is a good way to check with your caregiver about your health and preventive screening. It is a chance to share any concerns and updates on your health, and to receive a thorough exam.  Visit your dentist for a routine exam and preventive care every 6 months. Brush your teeth twice a day and floss once a day. Good oral hygiene prevents tooth decay and gum disease.  The frequency of eye exams is based on your age, health, family medical history, use of contact lenses, and other factors. Follow your caregiver's recommendations for frequency of eye exams.  Eat a healthy diet. Foods like vegetables, fruits, whole grains, low-fat dairy products, and lean protein foods contain the nutrients you need without too many calories. Decrease your intake of foods high in solid fats, added sugars, and salt. Eat the right amount of calories for you.Get information about a proper diet from your caregiver, if necessary.  Regular physical exercise is one of the most important things you can do for your health. Most adults should get at least 150 minutes of moderate-intensity exercise (any activity that increases your heart rate and causes you to sweat) each week. In addition, most adults need muscle-strengthening exercises on 2 or more days a week.  Maintain a healthy weight. The body mass index (BMI) is a screening tool to identify possible weight problems. It provides an estimate of body fat based on height and weight. Your caregiver can help determine your BMI, and can help you achieve or maintain a healthy weight.For adults 20 years and older:  A BMI below 18.5 is considered underweight.  A BMI of 18.5 to 24.9 is normal.  A BMI of 25 to 29.9 is considered overweight.  A BMI of 30 and above is  considered obese.  Maintain normal blood lipids and cholesterol levels by exercising and minimizing your intake of saturated fat. Eat a balanced diet with plenty of fruit and vegetables. Blood tests for lipids and cholesterol should begin at age 20 and be repeated every 5 years. If your lipid or cholesterol levels are high, you are over 50, or you are at high risk for heart disease, you may need your cholesterol levels checked more frequently.Ongoing high lipid and cholesterol levels should be treated with medicines if diet and exercise are not effective.  If you smoke, find out from your caregiver how to quit. If you do not use tobacco, do not start.  If you are pregnant, do not drink alcohol. If you are breastfeeding, be very cautious about drinking alcohol. If you are not pregnant and choose to drink alcohol, do not exceed 1 drink per day. One drink is considered to be 12 ounces (355 mL) of beer, 5 ounces (148 mL) of wine, or 1.5 ounces (44 mL) of liquor.  Avoid use of street drugs. Do not share needles with anyone. Ask for help if you need support or instructions about stopping the use of drugs.  High blood pressure causes heart disease and increases the risk of stroke. Your blood pressure should be checked at least every 1 to 2 years. Ongoing high blood pressure should be treated with medicines if weight loss and exercise are not effective.  If you are 55 to 37 years old, ask your caregiver if you should take aspirin to prevent strokes.  Diabetes   screening involves taking a blood sample to check your fasting blood sugar level. This should be done once every 3 years, after age 45, if you are within normal weight and without risk factors for diabetes. Testing should be considered at a younger age or be carried out more frequently if you are overweight and have at least 1 risk factor for diabetes.  Breast cancer screening is essential preventive care for women. You should practice "breast  self-awareness." This means understanding the normal appearance and feel of your breasts and may include breast self-examination. Any changes detected, no matter how small, should be reported to a caregiver. Women in their 20s and 30s should have a clinical breast exam (CBE) by a caregiver as part of a regular health exam every 1 to 3 years. After age 40, women should have a CBE every year. Starting at age 40, women should consider having a mammography (breast X-ray test) every year. Women who have a family history of breast cancer should talk to their caregiver about genetic screening. Women at a high risk of breast cancer should talk to their caregivers about having magnetic resonance imaging (MRI) and a mammography every year.  The Pap test is a screening test for cervical cancer. A Pap test can show cell changes on the cervix that might become cervical cancer if left untreated. A Pap test is a procedure in which cells are obtained and examined from the lower end of the uterus (cervix).  Women should have a Pap test starting at age 21.  Between ages 21 and 29, Pap tests should be repeated every 2 years.  Beginning at age 30, you should have a Pap test every 3 years as long as the past 3 Pap tests have been normal.  Some women have medical problems that increase the chance of getting cervical cancer. Talk to your caregiver about these problems. It is especially important to talk to your caregiver if a new problem develops soon after your last Pap test. In these cases, your caregiver may recommend more frequent screening and Pap tests.  The above recommendations are the same for women who have or have not gotten the vaccine for human papillomavirus (HPV).  If you had a hysterectomy for a problem that was not cancer or a condition that could lead to cancer, then you no longer need Pap tests. Even if you no longer need a Pap test, a regular exam is a good idea to make sure no other problems are  starting.  If you are between ages 65 and 70, and you have had normal Pap tests going back 10 years, you no longer need Pap tests. Even if you no longer need a Pap test, a regular exam is a good idea to make sure no other problems are starting.  If you have had past treatment for cervical cancer or a condition that could lead to cancer, you need Pap tests and screening for cancer for at least 20 years after your treatment.  If Pap tests have been discontinued, risk factors (such as a new sexual partner) need to be reassessed to determine if screening should be resumed.  The HPV test is an additional test that may be used for cervical cancer screening. The HPV test looks for the virus that can cause the cell changes on the cervix. The cells collected during the Pap test can be tested for HPV. The HPV test could be used to screen women aged 30 years and older, and should   be used in women of any age who have unclear Pap test results. After the age of 30, women should have HPV testing at the same frequency as a Pap test.  Colorectal cancer can be detected and often prevented. Most routine colorectal cancer screening begins at the age of 50 and continues through age 75. However, your caregiver may recommend screening at an earlier age if you have risk factors for colon cancer. On a yearly basis, your caregiver may provide home test kits to check for hidden blood in the stool. Use of a small camera at the end of a tube, to directly examine the colon (sigmoidoscopy or colonoscopy), can detect the earliest forms of colorectal cancer. Talk to your caregiver about this at age 50, when routine screening begins. Direct examination of the colon should be repeated every 5 to 10 years through age 75, unless early forms of pre-cancerous polyps or small growths are found.  Hepatitis C blood testing is recommended for all people born from 1945 through 1965 and any individual with known risks for hepatitis C.  Practice  safe sex. Use condoms and avoid high-risk sexual practices to reduce the spread of sexually transmitted infections (STIs). STIs include gonorrhea, chlamydia, syphilis, trichomonas, herpes, HPV, and human immunodeficiency virus (HIV). Herpes, HIV, and HPV are viral illnesses that have no cure. They can result in disability, cancer, and death. Sexually active women aged 25 and younger should be checked for chlamydia. Older women with new or multiple partners should also be tested for chlamydia. Testing for other STIs is recommended if you are sexually active and at increased risk.  Osteoporosis is a disease in which the bones lose minerals and strength with aging. This can result in serious bone fractures. The risk of osteoporosis can be identified using a bone density scan. Women ages 65 and over and women at risk for fractures or osteoporosis should discuss screening with their caregivers. Ask your caregiver whether you should take a calcium supplement or vitamin D to reduce the rate of osteoporosis.  Menopause can be associated with physical symptoms and risks. Hormone replacement therapy is available to decrease symptoms and risks. You should talk to your caregiver about whether hormone replacement therapy is right for you.  Use sunscreen with sun protection factor (SPF) of 30 or more. Apply sunscreen liberally and repeatedly throughout the day. You should seek shade when your shadow is shorter than you. Protect yourself by wearing long sleeves, pants, a wide-brimmed hat, and sunglasses year round, whenever you are outdoors.  Once a month, do a whole body skin exam, using a mirror to look at the skin on your back. Notify your caregiver of new moles, moles that have irregular borders, moles that are larger than a pencil eraser, or moles that have changed in shape or color.  Stay current with required immunizations.  Influenza. You need a dose every fall (or winter). The composition of the flu vaccine  changes each year, so being vaccinated once is not enough.  Pneumococcal polysaccharide. You need 1 to 2 doses if you smoke cigarettes or if you have certain chronic medical conditions. You need 1 dose at age 65 (or older) if you have never been vaccinated.  Tetanus, diphtheria, pertussis (Tdap, Td). Get 1 dose of Tdap vaccine if you are younger than age 65, are over 65 and have contact with an infant, are a healthcare worker, are pregnant, or simply want to be protected from whooping cough. After that, you need a Td   booster dose every 10 years. Consult your caregiver if you have not had at least 3 tetanus and diphtheria-containing shots sometime in your life or have a deep or dirty wound.  HPV. You need this vaccine if you are a woman age 26 or younger. The vaccine is given in 3 doses over 6 months.  Measles, mumps, rubella (MMR). You need at least 1 dose of MMR if you were born in 1957 or later. You may also need a second dose.  Meningococcal. If you are age 19 to 21 and a first-year college student living in a residence hall, or have one of several medical conditions, you need to get vaccinated against meningococcal disease. You may also need additional booster doses.  Zoster (shingles). If you are age 60 or older, you should get this vaccine.  Varicella (chickenpox). If you have never had chickenpox or you were vaccinated but received only 1 dose, talk to your caregiver to find out if you need this vaccine.  Hepatitis A. You need this vaccine if you have a specific risk factor for hepatitis A virus infection or you simply wish to be protected from this disease. The vaccine is usually given as 2 doses, 6 to 18 months apart.  Hepatitis B. You need this vaccine if you have a specific risk factor for hepatitis B virus infection or you simply wish to be protected from this disease. The vaccine is given in 3 doses, usually over 6 months. Preventive Services / Frequency Ages 19 to 39  Blood  pressure check.** / Every 1 to 2 years.  Lipid and cholesterol check.** / Every 5 years beginning at age 20.  Clinical breast exam.** / Every 3 years for women in their 20s and 30s.  Pap test.** / Every 2 years from ages 21 through 29. Every 3 years starting at age 30 through age 65 or 70 with a history of 3 consecutive normal Pap tests.  HPV screening.** / Every 3 years from ages 30 through ages 65 to 70 with a history of 3 consecutive normal Pap tests.  Hepatitis C blood test.** / For any individual with known risks for hepatitis C.  Skin self-exam. / Monthly.  Influenza immunization.** / Every year.  Pneumococcal polysaccharide immunization.** / 1 to 2 doses if you smoke cigarettes or if you have certain chronic medical conditions.  Tetanus, diphtheria, pertussis (Tdap, Td) immunization. / A one-time dose of Tdap vaccine. After that, you need a Td booster dose every 10 years.  HPV immunization. / 3 doses over 6 months, if you are 26 and younger.  Measles, mumps, rubella (MMR) immunization. / You need at least 1 dose of MMR if you were born in 1957 or later. You may also need a second dose.  Meningococcal immunization. / 1 dose if you are age 19 to 21 and a first-year college student living in a residence hall, or have one of several medical conditions, you need to get vaccinated against meningococcal disease. You may also need additional booster doses.  Varicella immunization.** / Consult your caregiver.  Hepatitis A immunization.** / Consult your caregiver. 2 doses, 6 to 18 months apart.  Hepatitis B immunization.** / Consult your caregiver. 3 doses usually over 6 months. Ages 40 to 64  Blood pressure check.** / Every 1 to 2 years.  Lipid and cholesterol check.** / Every 5 years beginning at age 20.  Clinical breast exam.** / Every year after age 40.  Mammogram.** / Every year beginning at age 40   and continuing for as long as you are in good health. Consult with your  caregiver.  Pap test.** / Every 3 years starting at age 30 through age 65 or 70 with a history of 3 consecutive normal Pap tests.  HPV screening.** / Every 3 years from ages 30 through ages 65 to 70 with a history of 3 consecutive normal Pap tests.  Fecal occult blood test (FOBT) of stool. / Every year beginning at age 50 and continuing until age 75. You may not need to do this test if you get a colonoscopy every 10 years.  Flexible sigmoidoscopy or colonoscopy.** / Every 5 years for a flexible sigmoidoscopy or every 10 years for a colonoscopy beginning at age 50 and continuing until age 75.  Hepatitis C blood test.** / For all people born from 1945 through 1965 and any individual with known risks for hepatitis C.  Skin self-exam. / Monthly.  Influenza immunization.** / Every year.  Pneumococcal polysaccharide immunization.** / 1 to 2 doses if you smoke cigarettes or if you have certain chronic medical conditions.  Tetanus, diphtheria, pertussis (Tdap, Td) immunization.** / A one-time dose of Tdap vaccine. After that, you need a Td booster dose every 10 years.  Measles, mumps, rubella (MMR) immunization. / You need at least 1 dose of MMR if you were born in 1957 or later. You may also need a second dose.  Varicella immunization.** / Consult your caregiver.  Meningococcal immunization.** / Consult your caregiver.  Hepatitis A immunization.** / Consult your caregiver. 2 doses, 6 to 18 months apart.  Hepatitis B immunization.** / Consult your caregiver. 3 doses, usually over 6 months. Ages 65 and over  Blood pressure check.** / Every 1 to 2 years.  Lipid and cholesterol check.** / Every 5 years beginning at age 20.  Clinical breast exam.** / Every year after age 40.  Mammogram.** / Every year beginning at age 40 and continuing for as long as you are in good health. Consult with your caregiver.  Pap test.** / Every 3 years starting at age 30 through age 65 or 70 with a 3  consecutive normal Pap tests. Testing can be stopped between 65 and 70 with 3 consecutive normal Pap tests and no abnormal Pap or HPV tests in the past 10 years.  HPV screening.** / Every 3 years from ages 30 through ages 65 or 70 with a history of 3 consecutive normal Pap tests. Testing can be stopped between 65 and 70 with 3 consecutive normal Pap tests and no abnormal Pap or HPV tests in the past 10 years.  Fecal occult blood test (FOBT) of stool. / Every year beginning at age 50 and continuing until age 75. You may not need to do this test if you get a colonoscopy every 10 years.  Flexible sigmoidoscopy or colonoscopy.** / Every 5 years for a flexible sigmoidoscopy or every 10 years for a colonoscopy beginning at age 50 and continuing until age 75.  Hepatitis C blood test.** / For all people born from 1945 through 1965 and any individual with known risks for hepatitis C.  Osteoporosis screening.** / A one-time screening for women ages 65 and over and women at risk for fractures or osteoporosis.  Skin self-exam. / Monthly.  Influenza immunization.** / Every year.  Pneumococcal polysaccharide immunization.** / 1 dose at age 65 (or older) if you have never been vaccinated.  Tetanus, diphtheria, pertussis (Tdap, Td) immunization. / A one-time dose of Tdap vaccine if you are over   65 and have contact with an infant, are a healthcare worker, or simply want to be protected from whooping cough. After that, you need a Td booster dose every 10 years.  Varicella immunization.** / Consult your caregiver.  Meningococcal immunization.** / Consult your caregiver.  Hepatitis A immunization.** / Consult your caregiver. 2 doses, 6 to 18 months apart.  Hepatitis B immunization.** / Check with your caregiver. 3 doses, usually over 6 months. ** Family history and personal history of risk and conditions may change your caregiver's recommendations. Document Released: 08/07/2001 Document Revised: 09/03/2011  Document Reviewed: 11/06/2010 [employer] Patient Information 2013 [employer]. Preparing for Pregnancy Preparing for pregnancy (preconceptual care) by getting counseling and information from your caregiver before getting pregnant is a good idea. It will help you and your baby have a better chance to have a healthy, safe pregnancy and delivery of your baby. Make an appointment with your caregiver to talk about your health, medical, and family history and how to prepare yourself before getting pregnant. Your caregiver will do a complete physical exam and a Pap test. They will want to know:  About you, your spouse or partner, and your family's medical and genetic history.  If you are eating a balanced diet and drinking enough fluids.  What vitamins and mineral supplements you are taking. This includes taking folic acid before getting pregnant to help prevent birth defects.  What medications you are taking including prescription, over-the-counter and herbal medications.  If there is any substance abuse like alcohol, smoking, and illegal drugs.  If there is any mental or physical domestic violence.  If there is any risk of sexually transmitted disease between you and your partner.  What immunizations and vaccinations you have had and what you may need before getting pregnant.  If you should get tested for HIV infection.  If there is any exposure to chemical or toxic substances at home or work.  If there are medical problems you have that need to be treated and kept under control before getting pregnant such as diabetes, high blood pressure or others.  If there were any past surgeries, pregnancies and problems with them.  What your current weight is and to set a goal as to how much weight you should gain while pregnant. Also, they will check if you should lose or gain weight before getting pregnant.  What is your exercise routine and what it is safe when you are pregnant.  If there are  any physical disabilities that need to be addressed.  About spacing your pregnancies when there are other children.  If there is a financial problem that may affect you having a child. After talking about the above points with your caregiver, your caregiver will give you advice on how to help treat and work with you on solving any issues, if necessary, before getting pregnant. The goal is to have a healthy and safe pregnancy for you and your baby. You should keep an accurate record of your menstrual periods because it will help in determining your due date. Immunizations that you should have before getting pregnant:   Regular measles, German measles (rubella) and mumps.  Tetanus and diphtheria.  Chickenpox, if not immune.  Herpes zoster (Varicella) if not immune.  Human papilloma virus vaccine (HPV) between the age of 9 and 26 years old.  Hepatitis A vaccine.  Hepatitis B vaccine.  Influenza vaccine.  Pneumococcal vaccine (pneumonia). You should avoid getting pregnant for one month after getting vaccinated with a live   virus vaccine such as German measles (rubella) vaccine. Other immunizations may be necessary depending on where you live, such as malaria. Ask your caregiver if any other immunizations are needed for you. HOME CARE INSTRUCTIONS   Follow the advice of your caregiver.  Before getting pregnant:  Begin taking vitamins, supplements, and 0.4 milligrams folic acid daily.  Get your immunizations up-to-date.  Get help from a nutrition counselor if you do not understand what a balanced diet is, need help with a special medical diet or if you need help to lose or gain weight.  Begin exercising.  Stop smoking, taking illegal drugs, and drinking alcoholic beverages.  Get counseling if there is and type of domestic violence.  Get checked for sexually transmitted diseases including HIV.  Get any medical problems under control (diabetes, high blood pressure, convulsions,  asthma or others).  Resolve any financial concerns.  Be sure you and your spouse or partner are ready to have a baby.  Keep an accurate record of your menstrual periods. Document Released: 05/24/2008 Document Revised: 09/03/2011 Document Reviewed: 05/24/2008 [employer] Patient Information 2013 [employer].  

## 2012-05-12 ENCOUNTER — Ambulatory Visit (INDEPENDENT_AMBULATORY_CARE_PROVIDER_SITE_OTHER): Payer: BC Managed Care – PPO | Admitting: Family Medicine

## 2012-05-12 VITALS — BP 112/81 | Wt 144.0 lb

## 2012-05-12 DIAGNOSIS — Z3480 Encounter for supervision of other normal pregnancy, unspecified trimester: Secondary | ICD-10-CM

## 2012-05-12 NOTE — Patient Instructions (Signed)
Pregnancy - First Trimester During sexual intercourse, millions of sperm go into the vagina. Only 1 sperm will penetrate and fertilize the female egg while it is in the Fallopian tube. One week later, the fertilized egg implants into the wall of the uterus. An embryo begins to develop into a baby. At 6 to 8 weeks, the eyes and face are formed and the heartbeat can be seen on ultrasound. At the end of 12 weeks (first trimester), all the baby's organs are formed. Now that you are pregnant, you will want to do everything you can to have a healthy baby. Two of the most important things are to get good prenatal care and follow your caregiver's instructions. Prenatal care is all the medical care you receive before the baby's birth. It is given to prevent, find, and treat problems during the pregnancy and childbirth. PRENATAL EXAMS  During prenatal visits, your weight, blood pressure and urine are checked. This is done to make sure you are healthy and progressing normally during the pregnancy.  A pregnant woman should gain 25 to 35 pounds during the pregnancy. However, if you are over weight or underweight, your caregiver will advise you regarding your weight.  Your caregiver will ask and answer questions for you.  Blood work, cervical cultures, other necessary tests and a Pap test are done during your prenatal exams. These tests are done to check on your health and the probable health of your baby. Tests are strongly recommended and done for HIV with your permission. This is the virus that causes AIDS. These tests are done because medications can be given to help prevent your baby from being born with this infection should you have been infected without knowing it. Blood work is also used to find out your blood type, previous infections and follow your blood levels (hemoglobin).  Low hemoglobin (anemia) is common during pregnancy. Iron and vitamins are given to help prevent this. Later in the pregnancy, blood  tests for diabetes will be done along with any other tests if any problems develop. You may need tests to make sure you and the baby are doing well.  You may need other tests to make sure you and the baby are doing well. CHANGES DURING THE FIRST TRIMESTER (THE FIRST 3 MONTHS OF PREGNANCY) Your body goes through many changes during pregnancy. They vary from person to person. Talk to your caregiver about changes you notice and are concerned about. Changes can include:  Your menstrual period stops.  The egg and sperm carry the genes that determine what you look like. Genes from you and your partner are forming a baby. The female genes determine whether the baby is a boy or a girl.  Your body increases in girth and you may feel bloated.  Feeling sick to your stomach (nauseous) and throwing up (vomiting). If the vomiting is uncontrollable, call your caregiver.  Your breasts will begin to enlarge and become tender.  Your nipples may stick out more and become darker.  The need to urinate more. Painful urination may mean you have a bladder infection.  Tiring easily.  Loss of appetite.  Cravings for certain kinds of food.  At first, you may gain or lose a couple of pounds.  You may have changes in your emotions from day to day (excited to be pregnant or concerned something may go wrong with the pregnancy and baby).  You may have more vivid and strange dreams. HOME CARE INSTRUCTIONS   It is very important   to avoid all smoking, alcohol and un-prescribed drugs during your pregnancy. These affect the formation and growth of the baby. Avoid chemicals while pregnant to ensure the delivery of a healthy infant.  Start your prenatal visits by the 12th week of pregnancy. They are usually scheduled monthly at first, then more often in the last 2 months before delivery. Keep your caregiver's appointments. Follow your caregiver's instructions regarding medication use, blood and lab tests, exercise, and  diet.  During pregnancy, you are providing food for you and your baby. Eat regular, well-balanced meals. Choose foods such as meat, fish, milk and other low fat dairy products, vegetables, fruits, and whole-grain breads and cereals. Your caregiver will tell you of the ideal weight gain.  You can help morning sickness by keeping soda crackers at the bedside. Eat a couple before arising in the morning. You may want to use the crackers without salt on them.  Eating 4 to 5 small meals rather than 3 large meals a day also may help the nausea and vomiting.  Drinking liquids between meals instead of during meals also seems to help nausea and vomiting.  A physical sexual relationship may be continued throughout pregnancy if there are no other problems. Problems may be early (premature) leaking of amniotic fluid from the membranes, vaginal bleeding, or belly (abdominal) pain.  Exercise regularly if there are no restrictions. Check with your caregiver or physical therapist if you are unsure of the safety of some of your exercises. Greater weight gain will occur in the last 2 trimesters of pregnancy. Exercising will help:  Control your weight.  Keep you in shape.  Prepare you for labor and delivery.  Help you lose your pregnancy weight after you deliver your baby.  Wear a good support or jogging bra for breast tenderness during pregnancy. This may help if worn during sleep too.  Ask when prenatal classes are available. Begin classes when they are offered.  Do not use hot tubs, steam rooms or saunas.  Wear your seat belt when driving. This protects you and your baby if you are in an accident.  Avoid raw meat, uncooked cheese, cat litter boxes and soil used by cats throughout the pregnancy. These carry germs that can cause birth defects in the baby.  The first trimester is a good time to visit your dentist for your dental health. Getting your teeth cleaned is OK. Use a softer toothbrush and brush  gently during pregnancy.  Ask for help if you have financial, counseling or nutritional needs during pregnancy. Your caregiver will be able to offer counseling for these needs as well as refer you for other special needs.  Do not take any medications or herbs unless told by your caregiver.  Inform your caregiver if there is any mental or physical domestic violence.  Make a list of emergency phone numbers of family, friends, hospital, and police and fire departments.  Write down your questions. Take them to your prenatal visit.  Do not douche.  Do not cross your legs.  If you have to stand for long periods of time, rotate you feet or take small steps in a circle.  You may have more vaginal secretions that may require a sanitary pad. Do not use tampons or scented sanitary pads. MEDICATIONS AND DRUG USE IN PREGNANCY  Take prenatal vitamins as directed. The vitamin should contain 1 milligram of folic acid. Keep all vitamins out of reach of children. Only a couple vitamins or tablets containing iron may be   fatal to a baby or young child when ingested.  Avoid use of all medications, including herbs, over-the-counter medications, not prescribed or suggested by your caregiver. Only take over-the-counter or prescription medicines for pain, discomfort, or fever as directed by your caregiver. Do not use aspirin, ibuprofen, or naproxen unless directed by your caregiver.  Let your caregiver also know about herbs you may be using.  Alcohol is related to a number of birth defects. This includes fetal alcohol syndrome. All alcohol, in any form, should be avoided completely. Smoking will cause low birth rate and premature babies.  Street or illegal drugs are very harmful to the baby. They are absolutely forbidden. A baby born to an addicted mother will be addicted at birth. The baby will go through the same withdrawal an adult does.  Let your caregiver know about any medications that you have to take  and for what reason you take them. MISCARRIAGE IS COMMON DURING PREGNANCY A miscarriage does not mean you did something wrong. It is not a reason to worry about getting pregnant again. Your caregiver will help you with questions you may have. If you have a miscarriage, you may need minor surgery. SEEK MEDICAL CARE IF:  You have any concerns or worries during your pregnancy. It is better to call with your questions if you feel they cannot wait, rather than worry about them. SEEK IMMEDIATE MEDICAL CARE IF:   An unexplained oral temperature above 102 F (38.9 C) develops, or as your caregiver suggests.  You have leaking of fluid from the vagina (birth canal). If leaking membranes are suspected, take your temperature and inform your caregiver of this when you call.  There is vaginal spotting or bleeding. Notify your caregiver of the amount and how many pads are used.  You develop a bad smelling vaginal discharge with a change in the color.  You continue to feel sick to your stomach (nauseated) and have no relief from remedies suggested. You vomit blood or coffee ground-like materials.  You lose more than 2 pounds of weight in 1 week.  You gain more than 2 pounds of weight in 1 week and you notice swelling of your face, hands, feet, or legs.  You gain 5 pounds or more in 1 week (even if you do not have swelling of your hands, face, legs, or feet).  You get exposed to German measles and have never had them.  You are exposed to fifth disease or chickenpox.  You develop belly (abdominal) pain. Round ligament discomfort is a common non-cancerous (benign) cause of abdominal pain in pregnancy. Your caregiver still must evaluate this.  You develop headache, fever, diarrhea, pain with urination, or shortness of breath.  You fall or are in a car accident or have any kind of trauma.  There is mental or physical violence in your home. Document Released: 06/05/2001 Document Revised: 09/03/2011  Document Reviewed: 12/07/2008 [employer] Patient Information 2013 [employer].  

## 2012-05-12 NOTE — Progress Notes (Signed)
Patient is here for New OB intake visit.  Blood work done and informal bedside ultrasound shows positive fetal heart rate at 138 BPM.  Patient is sure of her lmp as she has been actively trying to conceive and her husband travels so they have to plan there time wisely.  She is interested in genetic testing as early as possible and will follow up with the physician on the 5th of December to discuss her options.  She also has concerns regarding the possibility of her previous femoral neuropathy returning and if there are any ways to prevent this from happening again.  She will also discuss at her appointment.  She has decreased her smoking to 3 per day and does not plan to buy another pack once these are gone.  She will keep [place] notified of her progress.

## 2012-05-13 LAB — OBSTETRIC PANEL
Antibody Screen: NEGATIVE
Basophils Absolute: 0 10*3/uL (ref 0.0–0.1)
Basophils Relative: 0 % (ref 0–1)
Eosinophils Absolute: 0.1 10*3/uL (ref 0.0–0.7)
HCT: 36.3 % (ref 36.0–46.0)
Hemoglobin: 12.2 g/dL (ref 12.0–15.0)
MCH: 30.1 pg (ref 26.0–34.0)
MCHC: 33.6 g/dL (ref 30.0–36.0)
Monocytes Absolute: 0.4 10*3/uL (ref 0.1–1.0)
Monocytes Relative: 4 % (ref 3–12)
Neutrophils Relative %: 76 % (ref 43–77)
RDW: 13.2 % (ref 11.5–15.5)
Rh Type: POSITIVE

## 2012-05-14 LAB — CULTURE, OB URINE
Colony Count: NO GROWTH
Organism ID, Bacteria: NO GROWTH

## 2012-05-20 ENCOUNTER — Encounter: Payer: Self-pay | Admitting: Family Medicine

## 2012-05-20 ENCOUNTER — Ambulatory Visit (INDEPENDENT_AMBULATORY_CARE_PROVIDER_SITE_OTHER): Payer: BC Managed Care – PPO | Admitting: Family Medicine

## 2012-05-20 VITALS — BP 100/64 | Wt 147.0 lb

## 2012-05-20 DIAGNOSIS — Z3480 Encounter for supervision of other normal pregnancy, unspecified trimester: Secondary | ICD-10-CM

## 2012-05-20 DIAGNOSIS — O09529 Supervision of elderly multigravida, unspecified trimester: Secondary | ICD-10-CM

## 2012-05-20 DIAGNOSIS — R110 Nausea: Secondary | ICD-10-CM

## 2012-05-20 DIAGNOSIS — O219 Vomiting of pregnancy, unspecified: Secondary | ICD-10-CM

## 2012-05-20 MED ORDER — PROMETHAZINE HCL 25 MG PO TABS: 25.0000 mg | ORAL_TABLET | Freq: Four times a day (QID) | ORAL | Status: DC | PRN

## 2012-05-20 NOTE — Patient Instructions (Signed)
Pregnancy - First Trimester During sexual intercourse, millions of sperm go into the vagina. Only 1 sperm will penetrate and fertilize the female egg while it is in the Fallopian tube. One week later, the fertilized egg implants into the wall of the uterus. An embryo begins to develop into a baby. At 6 to 8 weeks, the eyes and face are formed and the heartbeat can be seen on ultrasound. At the end of 12 weeks (first trimester), all the baby's organs are formed. Now that you are pregnant, you will want to do everything you can to have a healthy baby. Two of the most important things are to get good prenatal care and follow your caregiver's instructions. Prenatal care is all the medical care you receive before the baby's birth. It is given to prevent, find, and treat problems during the pregnancy and childbirth. PRENATAL EXAMS  During prenatal visits, your weight, blood pressure and urine are checked. This is done to make sure you are healthy and progressing normally during the pregnancy.  A pregnant woman should gain 25 to 35 pounds during the pregnancy. However, if you are over weight or underweight, your caregiver will advise you regarding your weight.  Your caregiver will ask and answer questions for you.  Blood work, cervical cultures, other necessary tests and a Pap test are done during your prenatal exams. These tests are done to check on your health and the probable health of your baby. Tests are strongly recommended and done for HIV with your permission. This is the virus that causes AIDS. These tests are done because medications can be given to help prevent your baby from being born with this infection should you have been infected without knowing it. Blood work is also used to find out your blood type, previous infections and follow your blood levels (hemoglobin).  Low hemoglobin (anemia) is common during pregnancy. Iron and vitamins are given to help prevent this. Later in the pregnancy, blood  tests for diabetes will be done along with any other tests if any problems develop. You may need tests to make sure you and the baby are doing well.  You may need other tests to make sure you and the baby are doing well. CHANGES DURING THE FIRST TRIMESTER (THE FIRST 3 MONTHS OF PREGNANCY) Your body goes through many changes during pregnancy. They vary from person to person. Talk to your caregiver about changes you notice and are concerned about. Changes can include:  Your menstrual period stops.  The egg and sperm carry the genes that determine what you look like. Genes from you and your partner are forming a baby. The female genes determine whether the baby is a boy or a girl.  Your body increases in girth and you may feel bloated.  Feeling sick to your stomach (nauseous) and throwing up (vomiting). If the vomiting is uncontrollable, call your caregiver.  Your breasts will begin to enlarge and become tender.  Your nipples may stick out more and become darker.  The need to urinate more. Painful urination may mean you have a bladder infection.  Tiring easily.  Loss of appetite.  Cravings for certain kinds of food.  At first, you may gain or lose a couple of pounds.  You may have changes in your emotions from day to day (excited to be pregnant or concerned something may go wrong with the pregnancy and baby).  You may have more vivid and strange dreams. HOME CARE INSTRUCTIONS   It is very important   to avoid all smoking, alcohol and un-prescribed drugs during your pregnancy. These affect the formation and growth of the baby. Avoid chemicals while pregnant to ensure the delivery of a healthy infant.  Start your prenatal visits by the 12th week of pregnancy. They are usually scheduled monthly at first, then more often in the last 2 months before delivery. Keep your caregiver's appointments. Follow your caregiver's instructions regarding medication use, blood and lab tests, exercise, and  diet.  During pregnancy, you are providing food for you and your baby. Eat regular, well-balanced meals. Choose foods such as meat, fish, milk and other low fat dairy products, vegetables, fruits, and whole-grain breads and cereals. Your caregiver will tell you of the ideal weight gain.  You can help morning sickness by keeping soda crackers at the bedside. Eat a couple before arising in the morning. You may want to use the crackers without salt on them.  Eating 4 to 5 small meals rather than 3 large meals a day also may help the nausea and vomiting.  Drinking liquids between meals instead of during meals also seems to help nausea and vomiting.  A physical sexual relationship may be continued throughout pregnancy if there are no other problems. Problems may be early (premature) leaking of amniotic fluid from the membranes, vaginal bleeding, or belly (abdominal) pain.  Exercise regularly if there are no restrictions. Check with your caregiver or physical therapist if you are unsure of the safety of some of your exercises. Greater weight gain will occur in the last 2 trimesters of pregnancy. Exercising will help:  Control your weight.  Keep you in shape.  Prepare you for labor and delivery.  Help you lose your pregnancy weight after you deliver your baby.  Wear a good support or jogging bra for breast tenderness during pregnancy. This may help if worn during sleep too.  Ask when prenatal classes are available. Begin classes when they are offered.  Do not use hot tubs, steam rooms or saunas.  Wear your seat belt when driving. This protects you and your baby if you are in an accident.  Avoid raw meat, uncooked cheese, cat litter boxes and soil used by cats throughout the pregnancy. These carry germs that can cause birth defects in the baby.  The first trimester is a good time to visit your dentist for your dental health. Getting your teeth cleaned is OK. Use a softer toothbrush and brush  gently during pregnancy.  Ask for help if you have financial, counseling or nutritional needs during pregnancy. Your caregiver will be able to offer counseling for these needs as well as refer you for other special needs.  Do not take any medications or herbs unless told by your caregiver.  Inform your caregiver if there is any mental or physical domestic violence.  Make a list of emergency phone numbers of family, friends, hospital, and police and fire departments.  Write down your questions. Take them to your prenatal visit.  Do not douche.  Do not cross your legs.  If you have to stand for long periods of time, rotate you feet or take small steps in a circle.  You may have more vaginal secretions that may require a sanitary pad. Do not use tampons or scented sanitary pads. MEDICATIONS AND DRUG USE IN PREGNANCY  Take prenatal vitamins as directed. The vitamin should contain 1 milligram of folic acid. Keep all vitamins out of reach of children. Only a couple vitamins or tablets containing iron may be   fatal to a baby or young child when ingested.  Avoid use of all medications, including herbs, over-the-counter medications, not prescribed or suggested by your caregiver. Only take over-the-counter or prescription medicines for pain, discomfort, or fever as directed by your caregiver. Do not use aspirin, ibuprofen, or naproxen unless directed by your caregiver.  Let your caregiver also know about herbs you may be using.  Alcohol is related to a number of birth defects. This includes fetal alcohol syndrome. All alcohol, in any form, should be avoided completely. Smoking will cause low birth rate and premature babies.  Street or illegal drugs are very harmful to the baby. They are absolutely forbidden. A baby born to an addicted mother will be addicted at birth. The baby will go through the same withdrawal an adult does.  Let your caregiver know about any medications that you have to take  and for what reason you take them. MISCARRIAGE IS COMMON DURING PREGNANCY A miscarriage does not mean you did something wrong. It is not a reason to worry about getting pregnant again. Your caregiver will help you with questions you may have. If you have a miscarriage, you may need minor surgery. SEEK MEDICAL CARE IF:  You have any concerns or worries during your pregnancy. It is better to call with your questions if you feel they cannot wait, rather than worry about them. SEEK IMMEDIATE MEDICAL CARE IF:   An unexplained oral temperature above 102 F (38.9 C) develops, or as your caregiver suggests.  You have leaking of fluid from the vagina (birth canal). If leaking membranes are suspected, take your temperature and inform your caregiver of this when you call.  There is vaginal spotting or bleeding. Notify your caregiver of the amount and how many pads are used.  You develop a bad smelling vaginal discharge with a change in the color.  You continue to feel sick to your stomach (nauseated) and have no relief from remedies suggested. You vomit blood or coffee ground-like materials.  You lose more than 2 pounds of weight in 1 week.  You gain more than 2 pounds of weight in 1 week and you notice swelling of your face, hands, feet, or legs.  You gain 5 pounds or more in 1 week (even if you do not have swelling of your hands, face, legs, or feet).  You get exposed to German measles and have never had them.  You are exposed to fifth disease or chickenpox.  You develop belly (abdominal) pain. Round ligament discomfort is a common non-cancerous (benign) cause of abdominal pain in pregnancy. Your caregiver still must evaluate this.  You develop headache, fever, diarrhea, pain with urination, or shortness of breath.  You fall or are in a car accident or have any kind of trauma.  There is mental or physical violence in your home. Document Released: 06/05/2001 Document Revised: 09/03/2011  Document Reviewed: 12/07/2008 [employer] Patient Information 2013 [employer].  

## 2012-05-20 NOTE — Progress Notes (Signed)
Having some nausea, no emesis.  Subjective:    Whitney Contreras is a [id number] [redacted]w[redacted]d being seen today for her first obstetrical visit.  Her obstetrical history is significant for advanced maternal age. Patient does intend to breast feed. Pregnancy history fully reviewed.  Patient reports nausea, no bleeding and cramping x 2 days.  Filed Vitals:   05/20/12 1516  BP: 100/64  Weight: 147 lb (66.679 kg)    HISTORY: OB History    Grav Para Term Preterm Abortions TAB SAB Ect Mult Living   4 1 1  0 1 0 1 0 0 1     # Outc Date GA Lbr Len/2nd Wgt Sex Del Anes PTL Lv   1 SAB 2005    U   No No   2 TRM 7/09 [redacted]w[redacted]d  8lb6oz(3.799kg) M VAC Spinal No Yes   Comments: R femoral neuropathy post delivery in 2009/still has twinges in that R leg.   3 GRA            Comments: System Generated. Please review and update pregnancy details.   4 CUR              Past Medical History  Diagnosis Date  . 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 HSV-1 infection     Valtrex PRN  . Endometriosis   . Miscarriage     x2  . History of abnormal cervical Pap smear   . Anxiety   . Anemia     during pregnancy   Past Surgical History  Procedure Date  . Foot surgery     buion remove  . Laparoscopy   . Wisdom tooth extraction     x4   Family History  Problem Relation Age of Onset  . Adopted: Yes     Exam   Just had full physical on 10/4 with nml pap.  Abdomen: soft, non-tender; bowel sounds normal; no masses,  no organomegaly          Assessment:    Pregnancy: [id number] AMA-discussed limitations of testing, risks associated with testing.  Pt. Desires definitive diagnosis and desires amnio.  Will schedule with Genetics and MFM.      Plan:     Initial labs reviewed. Prenatal vitamins. Problem list reviewed and updated. Genetic Screening discussed Amniocentesis: ordered.  Ultrasound discussed; fetal survey: discussed.  Follow up in 4 weeks. 50% of 25 min visit spent on counseling  and coordination of care.    PRATT,TANYA S 05/20/2012

## 2012-05-20 NOTE — Progress Notes (Signed)
Having some constipation.  Nausea.

## 2012-05-29 ENCOUNTER — Encounter: Payer: BC Managed Care – PPO | Admitting: Obstetrics & Gynecology

## 2012-05-30 ENCOUNTER — Other Ambulatory Visit: Payer: Self-pay

## 2012-05-30 ENCOUNTER — Ambulatory Visit (HOSPITAL_COMMUNITY)
Admission: RE | Admit: 2012-05-30 | Discharge: 2012-05-30 | Disposition: A | Discharge status: Home / Self Care | Payer: BC Managed Care – PPO | Source: Ambulatory Visit | Attending: Family Medicine | Admitting: Family Medicine

## 2012-05-30 ENCOUNTER — Other Ambulatory Visit: Payer: Self-pay | Admitting: Family Medicine

## 2012-05-30 ENCOUNTER — Encounter (HOSPITAL_COMMUNITY): Payer: Self-pay

## 2012-05-30 DIAGNOSIS — IMO0002 Reserved for concepts with insufficient information to code with codable children: Secondary | ICD-10-CM | POA: Insufficient documentation

## 2012-05-30 DIAGNOSIS — O09529 Supervision of elderly multigravida, unspecified trimester: Secondary | ICD-10-CM | POA: Insufficient documentation

## 2012-06-02 ENCOUNTER — Other Ambulatory Visit: Payer: Self-pay

## 2012-06-02 ENCOUNTER — Encounter (HOSPITAL_COMMUNITY): Payer: Self-pay

## 2012-06-02 NOTE — Progress Notes (Signed)
Genetic Counseling  High-Risk Gestation Note  Appointment Date:  05/30/2012 Referred By: Reva Bores, MD Date of Birth:  June 29, 1975  Pregnancy History: [id number] Estimated Date of Delivery: 01/02/13 Estimated Gestational Age: [redacted]w[redacted]d Attending: Rema Fendt, MD    [name] was seen for genetic counseling regarding a maternal age of 36 y.o..  She was counseled regarding maternal age and the association with risk for chromosome conditions due to nondisjunction with aging of the ova.   We reviewed chromosomes, nondisjunction, and the associated 1 in 60 risk for fetal aneuploidy related to a maternal age of 36 y.o. at [redacted]w[redacted]d gestation.  She was counseled that the risk for aneuploidy decreases as gestational age increases, accounting for those pregnancies which spontaneously abort.  We specifically discussed Down syndrome (trisomy 64), trisomies 34 and 7, and sex chromosome aneuploidies (47,XXX and 47,XXY) including the common features and prognoses of each.   We reviewed available screening options including first trimester screening, Quad screening, noninvasive prenatal testing (NIPT) and detailed ultrasound.  Specifically, we discussed that NIPT analyzes cell free fetal DNA found in the maternal circulation. This test is not diagnostic for chromosome conditions, but can provide information regarding the presence or absence of extra fetal DNA for chromosomes 13, 18, 21, X, and Y, and missing fetal DNA for chromosome X and Y (Turner syndrome). Thus, it would not identify or rule out all genetic conditions. The reported detection rate is greater than 99% for Trisomy 21, greater than 98% for Trisomy 18, and is approximately 80% (8 out of 10) for Trisomy 13. The false positive rate is reported to be less than 0.1% for any of these conditions.  In addition, we discussed that ~50-80% of fetuses with Down syndrome and up to 90-95% of fetuses with trisomy 18/13, when well visualized, have detectable  anomalies or soft markers by detailed ultrasound (~18+ weeks gestation).   [name]  was also counseled regarding diagnostic testing via chorionic villus sampling (CVS) and amniocentesis.  We reviewed the approximate 1 in 100 risk for complications from CVS (performed in the first trimester) and the approximate 1 in 300-500 risk for complications with amniocentesis (performed after [redacted] weeks gestation), including spontaneous pregnancy loss. After consideration of all the options, she elected to proceed with amniocentesis at approximately [redacted] weeks gestation. Amniocentesis is scheduled for 07/15/12 in our office. Ultrasound for nuchal translucency assessment (at approximately [redacted] weeks gestation) as well as detailed ultrasound in the second trimester are available to the patient during the current pregnancy.   Both family histories were reviewed and found to be noncontributory for birth defects, mental retardation, and known genetic conditions. However, the patient is adopted and the father of the pregnancy's father was adopted. Thus, their family histories are unknown. We thus cannot comment on how the family histories may contribute to the risk for a birth defect or genetic condition in the current pregnancy. Without further information regarding the provided family history, an accurate genetic risk cannot be calculated. Further genetic counseling is warranted if more information is obtained.  [name] was provided with written information regarding cystic fibrosis (CF) including the carrier frequency and incidence in the Caucasian population, the availability of carrier testing and prenatal diagnosis if indicated.  In addition, we discussed that CF is routinely screened for as part of the [place] newborn screening panel.  She elected to proceed with CF carrier screening at the time of today's visit.   Carrier screening for Spinal Muscular Atrophy (SMA)  was also discussed. SMA is a genetic  condition that follows autosomal recessive inheritance, meaning both parents must be carriers in order for a pregnancy to have a 1 in 4 (25%) chance to inherit the condition. SMA is characterized by progressive weakness of voluntary muscles. The age of onset and severity of symptoms is variable, with the most common type leading to severe involvement by age 59 years. However, we discussed that genotype:phenotype correlation is not straightforward. Approximately 1 in 65 individuals in the Caucasian population are carriers. Carrier screening detects approximately 95% of carriers in the Caucasian population. Therefore, screening can reduce but not eliminate the chance to be a carrier. If one partner is an SMA carrier, then carrier screening would be offered to the other partner. After careful consideration, [name] elected to proceed with SMA carrier screening at the time of today's visit.   [name] expressed interest in additional available genetic carrier screening given her unknown family history. We discussed the option of carrier screening for Fragile X syndrome, which is a type of inherited mental retardation syndrome and is more common in males given the X-linked inheritance.  Carrier screening is used to help identify those individuals that may be at increased risk to have affected children, although the information it can provide is limited in some instances and has the potential to reveal personal predispositions for some health problems in carriers that some people may or may not want to know about themselves.  After careful consideration, [name] elected to proceed with carrier screening for Fragile X syndrome at this time. We will contact the patient when results are available from CF, SMA, and Fragile X carrier screens.   [name] denied exposure to environmental toxins. She denied the use of alcohol or street drugs. She reported smoking 3-4 cigarettes per day. The  associations of smoking in pregnancy were reviewed and cessation encouraged. She denied significant viral illnesses during the course of her pregnancy. She reported taking xanax as needed. She reported that she does not take Xanax every day, and the last dose was 2 weeks ago.  Available animal study data do not suggest an increased risk for birth defects with prenatal alprazolam (Xanax) use, except at very high dose exposure. Available human data regarding xanax use in pregnancy have not indicated an increased risk for birth defects. We reviewed that other benzodiazepines have been suggested to increase the risk for birth defects, such as cleft lip and palate. However, this association has not been proven. We discussed that third trimester use of alprazolam is associated with an increased risk for neonatal withdrawal symptoms. Her medical and surgical histories were otherwise noncontributory.     I counseled [name] regarding the above risks and available options.  The approximate face-to-face time with the genetic counselor was 45 minutes.    Quinn Plowman, MS,  Certified Genetic Counselor 06/02/2012

## 2012-06-12 ENCOUNTER — Telehealth (HOSPITAL_COMMUNITY): Payer: Self-pay | Admitting: MS"

## 2012-06-12 ENCOUNTER — Ambulatory Visit (INDEPENDENT_AMBULATORY_CARE_PROVIDER_SITE_OTHER): Payer: BC Managed Care – PPO | Admitting: Obstetrics & Gynecology

## 2012-06-12 VITALS — BP 112/79 | Wt 146.0 lb

## 2012-06-12 DIAGNOSIS — Z3480 Encounter for supervision of other normal pregnancy, unspecified trimester: Secondary | ICD-10-CM

## 2012-06-12 DIAGNOSIS — Z3490 Encounter for supervision of normal pregnancy, unspecified, unspecified trimester: Secondary | ICD-10-CM

## 2012-06-12 NOTE — Telephone Encounter (Signed)
Called patient regarding results of carrier screening performed (cystic fibrosis, Fragile X, and spinal muscular atrophy). Patient not available and unable to leave message given that voice mailbox is full.   Clydie Braun Lasondra Hodgkins 06/12/2012 1:00 PM

## 2012-06-12 NOTE — Patient Instructions (Signed)
Return to clinic for any obstetric concerns or go to [facility] for evaluation  

## 2012-06-12 NOTE — Telephone Encounter (Signed)
Called Ms. Huston Foley to discussed results of carrier screening previously performed. Carrier screening for cystic fibrosis yielded a normal/negative result for the 97 most common disease-causing mutations, meaning that the risk to be a CF carrier can be reduced from 1 in 25 to approximately 1 in 343.  Therefore, the risk for cystic fibrosis in her current pregnancy is significantly reduced. The patient understands that CF carrier screening can reduce but not eliminate the chance to be a CF carrier.   Additionally, carrier screening for Fragile X syndrome was negative for a fragile X expansion mutation, indicating that the patient is not a carrier for fragile X syndrome.   Carrier screening for Spinal Muscular Atrophy (SMA) indicated that the patient has two SMN1 copy numbers. Thus, her risk to be a carrier for SMA has been reduced from approximately 1 in 47 to approximately 1 in 834.   Ms. Widger is scheduled for amniocentesis on 07/15/12. She inquired about turnaround time of these results. We reviewed that fluorescent in situ hybridization (FISH) results are typically available the day after the procedure and final karyotype results are back in approximately 7-10 days.   Clydie Braun Suellen Durocher 06/12/2012 4:43 PM

## 2012-06-12 NOTE — Telephone Encounter (Signed)
Left message for patient to return call regarding information.   Whitney Contreras 06/12/2012 3:14 PM

## 2012-06-12 NOTE — Progress Notes (Signed)
Had genetic counseling at MFM, decided to have amniocentesis, scheduled on 07/18/12.  Declined NT, first trimester screening.  No other complaints or concerns.  Obstetric precautions reviewed.

## 2012-06-16 ENCOUNTER — Encounter: Payer: BC Managed Care – PPO | Admitting: Family Medicine

## 2012-06-25 NOTE — L&D Delivery Note (Signed)
Delivery Note At 3:59 PM a viable female was delivered via Vaginal, Spontaneous Delivery (Presentation: Left Occiput Anterior).  APGAR: 9, 9; weight 7 lb 6 oz (3345 g).   Placenta status: Intact, Manual removal.  Cord: 3 vessels with the following complications: None.  Cord pH: Not collected  Anesthesia: Epidural  Episiotomy: None Lacerations: 2nd degree;Perineal Suture Repair: 3.0 vicryl Est. Blood Loss (mL): 350  Mom to postpartum.  Baby to nursery-stable.  [name] 01/09/2013, 7:21 PM

## 2012-06-27 ENCOUNTER — Telehealth: Payer: Self-pay | Admitting: *Deleted

## 2012-06-27 DIAGNOSIS — O09529 Supervision of elderly multigravida, unspecified trimester: Secondary | ICD-10-CM

## 2012-06-27 NOTE — Telephone Encounter (Signed)
Patient has decided that she does not want to go through with amnio at this time and wishes to move forward instead with first trimester screen and possible harmony testing.

## 2012-07-01 ENCOUNTER — Other Ambulatory Visit: Payer: Self-pay | Admitting: Family Medicine

## 2012-07-01 DIAGNOSIS — Z3682 Encounter for antenatal screening for nuchal translucency: Secondary | ICD-10-CM

## 2012-07-02 ENCOUNTER — Ambulatory Visit (HOSPITAL_COMMUNITY)
Admission: RE | Admit: 2012-07-02 | Discharge: 2012-07-02 | Disposition: A | Discharge status: Home / Self Care | Payer: BC Managed Care – PPO | Source: Ambulatory Visit | Attending: Family Medicine | Admitting: Family Medicine

## 2012-07-02 ENCOUNTER — Other Ambulatory Visit: Payer: Self-pay | Admitting: Family Medicine

## 2012-07-02 ENCOUNTER — Other Ambulatory Visit: Payer: Self-pay

## 2012-07-02 VITALS — BP 129/76 | HR 100 | Wt 151.0 lb

## 2012-07-02 DIAGNOSIS — Z3689 Encounter for other specified antenatal screening: Secondary | ICD-10-CM | POA: Insufficient documentation

## 2012-07-02 DIAGNOSIS — O09529 Supervision of elderly multigravida, unspecified trimester: Secondary | ICD-10-CM

## 2012-07-02 DIAGNOSIS — O3510X0 Maternal care for (suspected) chromosomal abnormality in fetus, unspecified, not applicable or unspecified: Secondary | ICD-10-CM | POA: Insufficient documentation

## 2012-07-02 DIAGNOSIS — Z3490 Encounter for supervision of normal pregnancy, unspecified, unspecified trimester: Secondary | ICD-10-CM

## 2012-07-02 DIAGNOSIS — Z3682 Encounter for antenatal screening for nuchal translucency: Secondary | ICD-10-CM

## 2012-07-02 DIAGNOSIS — O351XX0 Maternal care for (suspected) chromosomal abnormality in fetus, not applicable or unspecified: Secondary | ICD-10-CM | POA: Insufficient documentation

## 2012-07-02 NOTE — Progress Notes (Signed)
Whitney Contreras was seen for ultrasound appointment today.  Please see [facility] report for details.

## 2012-07-11 ENCOUNTER — Encounter: Payer: Self-pay | Admitting: Family Medicine

## 2012-07-11 ENCOUNTER — Ambulatory Visit (INDEPENDENT_AMBULATORY_CARE_PROVIDER_SITE_OTHER): Payer: BC Managed Care – PPO | Admitting: Family Medicine

## 2012-07-11 ENCOUNTER — Telehealth (HOSPITAL_COMMUNITY): Payer: Self-pay | Admitting: MS"

## 2012-07-11 VITALS — BP 108/64 | Wt 152.0 lb

## 2012-07-11 DIAGNOSIS — Z3490 Encounter for supervision of normal pregnancy, unspecified, unspecified trimester: Secondary | ICD-10-CM

## 2012-07-11 DIAGNOSIS — Z3480 Encounter for supervision of other normal pregnancy, unspecified trimester: Secondary | ICD-10-CM

## 2012-07-11 NOTE — Telephone Encounter (Signed)
Left message for patient to return call.   Whitney Contreras 4:14 PM 07/11/2012

## 2012-07-11 NOTE — Telephone Encounter (Signed)
Called Whitney Contreras to discuss her [facility], cell free fetal DNA testing. Testing was offered because of advanced maternal age. We reviewed that these are within normal limits, showing a less than 1 in 10,000 risk for trisomies 21, 18 and 13. We reviewed that this testing identifies > 99% of pregnancies with trisomy 21, >98% of pregnancies with trisomy 62, and >80% with trisomy 14; the false positive rate is <0.1% for all conditions. Testing was also performed for X and Y chromosome analysis. This did not show evidence of aneuploidy for X or Y. It was also consistent with female gender. X and Y analysis has a detection rate of approximately 99%. She understands that this testing does not identify all genetic conditions.   Whitney Contreras stated that she is comfortable with these results and declines amniocentesis at this time. She is scheduled for detailed ultrasound on 07/30/12. She stated that if any concerns were visualized on the anatomy ultrasound, then she would consider pursuing amniocentesis at that time. All questions were answered to her satisfaction, she was encouraged to call with additional questions or concerns.  Quinn Plowman, MS Certified Genetic Counselor 07/11/2012 5:24 PM

## 2012-07-11 NOTE — Patient Instructions (Signed)
Pregnancy - Second Trimester The second trimester of pregnancy (3 to 6 months) is a period of rapid growth for you and your baby. At the end of the sixth month, your baby is about 9 inches long and weighs 1 1/2 pounds. You will begin to feel the baby move between 18 and 20 weeks of the pregnancy. This is called quickening. Weight gain is faster. A clear fluid (colostrum) may leak out of your breasts. You may feel small contractions of the womb (uterus). This is known as false labor or Braxton-Hicks contractions. This is like a practice for labor when the baby is ready to be born. Usually, the problems with morning sickness have usually passed by the end of your first trimester. Some women develop small dark blotches (called cholasma, mask of pregnancy) on their face that usually goes away after the baby is born. Exposure to the sun makes the blotches worse. Acne may also develop in some pregnant women and pregnant women who have acne, may find that it goes away. PRENATAL EXAMS  Blood work may continue to be done during prenatal exams. These tests are done to check on your health and the probable health of your baby. Blood work is used to follow your blood levels (hemoglobin). Anemia (low hemoglobin) is common during pregnancy. Iron and vitamins are given to help prevent this. You will also be checked for diabetes between 24 and 28 weeks of the pregnancy. Some of the previous blood tests may be repeated.  The size of the uterus is measured during each visit. This is to make sure that the baby is continuing to grow properly according to the dates of the pregnancy.  Your blood pressure is checked every prenatal visit. This is to make sure you are not getting toxemia.  Your urine is checked to make sure you do not have an infection, diabetes or protein in the urine.  Your weight is checked often to make sure gains are happening at the suggested rate. This is to ensure that both you and your baby are  growing normally.  Sometimes, an ultrasound is performed to confirm the proper growth and development of the baby. This is a test which bounces harmless sound waves off the baby so your caregiver can more accurately determine due dates. Sometimes, a specialized test is done on the amniotic fluid surrounding the baby. This test is called an amniocentesis. The amniotic fluid is obtained by sticking a needle into the belly (abdomen). This is done to check the chromosomes in instances where there is a concern about possible genetic problems with the baby. It is also sometimes done near the end of pregnancy if an early delivery is required. In this case, it is done to help make sure the baby's lungs are mature enough for the baby to live outside of the womb. CHANGES OCCURING IN THE SECOND TRIMESTER OF PREGNANCY Your body goes through many changes during pregnancy. They vary from person to person. Talk to your caregiver about changes you notice that you are concerned about.  During the second trimester, you will likely have an increase in your appetite. It is normal to have cravings for certain foods. This varies from person to person and pregnancy to pregnancy.  Your lower abdomen will begin to bulge.  You may have to urinate more often because the uterus and baby are pressing on your bladder. It is also common to get more bladder infections during pregnancy (pain with urination). You can help this by   drinking lots of fluids and emptying your bladder before and after intercourse.  You may begin to get stretch marks on your hips, abdomen, and breasts. These are normal changes in the body during pregnancy. There are no exercises or medications to take that prevent this change.  You may begin to develop swollen and bulging veins (varicose veins) in your legs. Wearing support hose, elevating your feet for 15 minutes, 3 to 4 times a day and limiting salt in your diet helps lessen the problem.  Heartburn may  develop as the uterus grows and pushes up against the stomach. Antacids recommended by your caregiver helps with this problem. Also, eating smaller meals 4 to 5 times a day helps.  Constipation can be treated with a stool softener or adding bulk to your diet. Drinking lots of fluids, vegetables, fruits, and whole grains are helpful.  Exercising is also helpful. If you have been very active up until your pregnancy, most of these activities can be continued during your pregnancy. If you have been less active, it is helpful to start an exercise program such as walking.  Hemorrhoids (varicose veins in the rectum) may develop at the end of the second trimester. Warm sitz baths and hemorrhoid cream recommended by your caregiver helps hemorrhoid problems.  Backaches may develop during this time of your pregnancy. Avoid heavy lifting, wear low heal shoes and practice good posture to help with backache problems.  Some pregnant women develop tingling and numbness of their hand and fingers because of swelling and tightening of ligaments in the wrist (carpel tunnel syndrome). This goes away after the baby is born.  As your breasts enlarge, you may have to get a bigger bra. Get a comfortable, cotton, support bra. Do not get a nursing bra until the last month of the pregnancy if you will be nursing the baby.  You may get a dark line from your belly button to the pubic area called the linea nigra.  You may develop rosy cheeks because of increase blood flow to the face.  You may develop spider looking lines of the face, neck, arms and chest. These go away after the baby is born. HOME CARE INSTRUCTIONS   It is extremely important to avoid all smoking, herbs, alcohol, and unprescribed drugs during your pregnancy. These chemicals affect the formation and growth of the baby. Avoid these chemicals throughout the pregnancy to ensure the delivery of a healthy infant.  Most of your home care instructions are the same  as suggested for the first trimester of your pregnancy. Keep your caregiver's appointments. Follow your caregiver's instructions regarding medication use, exercise and diet.  During pregnancy, you are providing food for you and your baby. Continue to eat regular, well-balanced meals. Choose foods such as meat, fish, milk and other low fat dairy products, vegetables, fruits, and whole-grain breads and cereals. Your caregiver will tell you of the ideal weight gain.  A physical sexual relationship may be continued up until near the end of pregnancy if there are no other problems. Problems could include early (premature) leaking of amniotic fluid from the membranes, vaginal bleeding, abdominal pain, or other medical or pregnancy problems.  Exercise regularly if there are no restrictions. Check with your caregiver if you are unsure of the safety of some of your exercises. The greatest weight gain will occur in the last 2 trimesters of pregnancy. Exercise will help you:  Control your weight.  Get you in shape for labor and delivery.  Lose weight   after you have the baby.  Wear a good support or jogging bra for breast tenderness during pregnancy. This may help if worn during sleep. Pads or tissues may be used in the bra if you are leaking colostrum.  Do not use hot tubs, steam rooms or saunas throughout the pregnancy.  Wear your seat belt at all times when driving. This protects you and your baby if you are in an accident.  Avoid raw meat, uncooked cheese, cat litter boxes and soil used by cats. These carry germs that can cause birth defects in the baby.  The second trimester is also a good time to visit your dentist for your dental health if this has not been done yet. Getting your teeth cleaned is OK. Use a soft toothbrush. Brush gently during pregnancy.  It is easier to loose urine during pregnancy. Tightening up and strengthening the pelvic muscles will help with this problem. Practice stopping  your urination while you are going to the bathroom. These are the same muscles you need to strengthen. It is also the muscles you would use as if you were trying to stop from passing gas. You can practice tightening these muscles up 10 times a set and repeating this about 3 times per day. Once you know what muscles to tighten up, do not perform these exercises during urination. It is more likely to contribute to an infection by backing up the urine.  Ask for help if you have financial, counseling or nutritional needs during pregnancy. Your caregiver will be able to offer counseling for these needs as well as refer you for other special needs.  Your skin may become oily. If so, wash your face with mild soap, use non-greasy moisturizer and oil or cream based makeup. MEDICATIONS AND DRUG USE IN PREGNANCY  Take prenatal vitamins as directed. The vitamin should contain 1 milligram of folic acid. Keep all vitamins out of reach of children. Only a couple vitamins or tablets containing iron may be fatal to a baby or young child when ingested.  Avoid use of all medications, including herbs, over-the-counter medications, not prescribed or suggested by your caregiver. Only take over-the-counter or prescription medicines for pain, discomfort, or fever as directed by your caregiver. Do not use aspirin.  Let your caregiver also know about herbs you may be using.  Alcohol is related to a number of birth defects. This includes fetal alcohol syndrome. All alcohol, in any form, should be avoided completely. Smoking will cause low birth rate and premature babies.  Street or illegal drugs are very harmful to the baby. They are absolutely forbidden. A baby born to an addicted mother will be addicted at birth. The baby will go through the same withdrawal an adult does. SEEK MEDICAL CARE IF:  You have any concerns or worries during your pregnancy. It is better to call with your questions if you feel they cannot wait,  rather than worry about them. SEEK IMMEDIATE MEDICAL CARE IF:   An unexplained oral temperature above 102 F (38.9 C) develops, or as your caregiver suggests.  You have leaking of fluid from the vagina (birth canal). If leaking membranes are suspected, take your temperature and tell your caregiver of this when you call.  There is vaginal spotting, bleeding, or passing clots. Tell your caregiver of the amount and how many pads are used. Light spotting in pregnancy is common, especially following intercourse.  You develop a bad smelling vaginal discharge with a change in the color from clear   to white.  You continue to feel sick to your stomach (nauseated) and have no relief from remedies suggested. You vomit blood or coffee ground-like materials.  You lose more than 2 pounds of weight or gain more than 2 pounds of weight over 1 week, or as suggested by your caregiver.  You notice swelling of your face, hands, feet, or legs.  You get exposed to German measles and have never had them.  You are exposed to fifth disease or chickenpox.  You develop belly (abdominal) pain. Round ligament discomfort is a common non-cancerous (benign) cause of abdominal pain in pregnancy. Your caregiver still must evaluate you.  You develop a bad headache that does not go away.  You develop fever, diarrhea, pain with urination, or shortness of breath.  You develop visual problems, blurry, or double vision.  You fall or are in a car accident or any kind of trauma.  There is mental or physical violence at home. Document Released: 06/05/2001 Document Revised: 09/03/2011 Document Reviewed: 12/08/2008 [employer] Patient Information 2013 [employer].  Breastfeeding Deciding to breastfeed is one of the best choices you can make for you and your baby. The information that follows gives a brief overview of the benefits of breastfeeding as well as common topics surrounding breastfeeding. BENEFITS OF  BREASTFEEDING For the baby  The first milk (colostrum) helps the baby's digestive system function better.   There are antibodies in the mother's milk that help the baby fight off infections.   The baby has a lower incidence of asthma, allergies, and sudden infant death syndrome (SIDS).   The nutrients in breast milk are better for the baby than infant formulas, and breast milk helps the baby's brain grow better.   Babies who breastfeed have less gas, colic, and constipation.  For the mother  Breastfeeding helps develop a very special bond between the mother and her baby.   Breastfeeding is convenient, always available at the correct temperature, and costs nothing.   Breastfeeding burns calories in the mother and helps her lose weight that was gained during pregnancy.   Breastfeeding makes the uterus contract back down to normal size faster and slows bleeding following delivery.   Breastfeeding mothers have a lower risk of developing breast cancer.  BREASTFEEDING FREQUENCY  A healthy, full-term baby may breastfeed as often as every hour or space his or her feedings to every 3 hours.   Watch your baby for signs of hunger. Nurse your baby if he or she shows signs of hunger. How often you nurse will vary from baby to baby.   Nurse as often as the baby requests, or when you feel the need to reduce the fullness of your breasts.   Awaken the baby if it has been 3 4 hours since the last feeding.   Frequent feeding will help the mother make more milk and will help prevent problems, such as sore nipples and engorgement of the breasts.  BABY'S POSITION AT THE BREAST  Whether lying down or sitting, be sure that the baby's tummy is facing your tummy.   Support the breast with 4 fingers underneath the breast and the thumb above. Make sure your fingers are well away from the nipple and baby's mouth.   Stroke the baby's lips gently with your finger or nipple.   When the  baby's mouth is open wide enough, place all of your nipple and as much of the areola as possible into your baby's mouth.   Pull the baby in   close so the tip of the nose and the baby's cheeks touch the breast during the feeding.  FEEDINGS AND SUCTION  The length of each feeding varies from baby to baby and from feeding to feeding.   The baby must suck about 2 3 minutes for your milk to get to him or her. This is called a "let down." For this reason, allow the baby to feed on each breast as long as he or she wants. Your baby will end the feeding when he or she has received the right balance of nutrients.   To break the suction, put your finger into the corner of the baby's mouth and slide it between his or her gums before removing your breast from his or her mouth. This will help prevent sore nipples.  HOW TO TELL WHETHER YOUR BABY IS GETTING ENOUGH BREAST MILK. Wondering whether or not your baby is getting enough milk is a common concern among mothers. You can be assured that your baby is getting enough milk if:   Your baby is actively sucking and you hear swallowing.   Your baby seems relaxed and satisfied after a feeding.   Your baby nurses at least 8 12 times in a 24 hour time period. Nurse your baby until he or she unlatches or falls asleep at the first breast (at least 10 20 minutes), then offer the second side.   Your baby is wetting 5 6 disposable diapers (6 8 cloth diapers) in a 24 hour period by 5 6 days of age.   Your baby is having at least 3 4 stools every 24 hours for the first 6 weeks. The stool should be soft and yellow.   Your baby should gain 4 7 ounces per week after he or she is 4 days old.   Your breasts feel softer after nursing.  REDUCING BREAST ENGORGEMENT  In the first week after your baby is born, you may experience signs of breast engorgement. When breasts are engorged, they feel heavy, warm, full, and may be tender to the touch. You can reduce  engorgement if you:   Nurse frequently, every 2 3 hours. Mothers who breastfeed early and often have fewer problems with engorgement.   Place light ice packs on your breasts for 10 20 minutes between feedings. This reduces swelling. Wrap the ice packs in a lightweight towel to protect your skin. Bags of frozen vegetables work well for this purpose.   Take a warm shower or apply warm, moist heat to your breast for 5 10 minutes just before each feeding. This increases circulation and helps the milk flow.   Gently massage your breast before and during the feeding. Using your finger tips, massage from the chest wall towards your nipple in a circular motion.   Make sure that the baby empties at least one breast at every feeding before switching sides.   Use a breast pump to empty the breasts if your baby is sleepy or not nursing well. You may also want to pump if you are returning to work oryou feel you are getting engorged.   Avoid bottle feeds, pacifiers, or supplemental feedings of water or juice in place of breastfeeding. Breast milk is all the food your baby needs. It is not necessary for your baby to have water or formula. In fact, to help your breasts make more milk, it is best not to give your baby supplemental feedings during the early weeks.   Be sure the baby is latched   on and positioned properly while breastfeeding.   Wear a supportive bra, avoiding underwire styles.   Eat a balanced diet with enough fluids.   Rest often, relax, and take your prenatal vitamins to prevent fatigue, stress, and anemia.  If you follow these suggestions, your engorgement should improve in 24 48 hours. If you are still experiencing difficulty, call your lactation consultant or caregiver.  CARING FOR YOURSELF Take care of your breasts  Bathe or shower daily.   Avoid using soap on your nipples.   Start feedings on your left breast at one feeding and on your right breast at the next  feeding.   You will notice an increase in your milk supply 2 5 days after delivery. You may feel some discomfort from engorgement, which makes your breasts very firm and often tender. Engorgement "peaks" out within 24 48 hours. In the meantime, apply warm moist towels to your breasts for 5 10 minutes before feeding. Gentle massage and expression of some milk before feeding will soften your breasts, making it easier for your baby to latch on.   Wear a well-fitting nursing bra, and air dry your nipples for a 3 4minutes after each feeding.   Only use cotton bra pads.   Only use pure lanolin on your nipples after nursing. You do not need to wash it off before feeding the baby again. Another option is to express a few drops of breast milk and gently massage it into your nipples.  Take care of yourself  Eat well-balanced meals and nutritious snacks.   Drinking milk, fruit juice, and water to satisfy your thirst (about 8 glasses a day).   Get plenty of rest.  Avoid foods that you notice affect the baby in a bad way.  SEEK MEDICAL CARE IF:   You have difficulty with breastfeeding and need help.   You have a hard, red, sore area on your breast that is accompanied by a fever.   Your baby is too sleepy to eat well or is having trouble sleeping.   Your baby is wetting less than 6 diapers a day, by 5 days of age.   Your baby's skin or white part of his or her eyes is more yellow than it was in the hospital.   You feel depressed.  Document Released: 06/11/2005 Document Revised: 12/11/2011 Document Reviewed: 09/09/2011 [employer] Patient Information 2013 [employer].  

## 2012-07-11 NOTE — Progress Notes (Signed)
Doing well---Has now had [facility] and will forego amnio if negative--awaiting results.

## 2012-07-15 ENCOUNTER — Ambulatory Visit (HOSPITAL_COMMUNITY): Payer: BC Managed Care – PPO

## 2012-07-15 ENCOUNTER — Ambulatory Visit (INDEPENDENT_AMBULATORY_CARE_PROVIDER_SITE_OTHER): Payer: BC Managed Care – PPO | Admitting: Family Medicine

## 2012-07-15 VITALS — BP 124/80 | Wt 151.0 lb

## 2012-07-15 DIAGNOSIS — Z3490 Encounter for supervision of normal pregnancy, unspecified, unspecified trimester: Secondary | ICD-10-CM

## 2012-07-15 DIAGNOSIS — O09529 Supervision of elderly multigravida, unspecified trimester: Secondary | ICD-10-CM

## 2012-07-15 DIAGNOSIS — IMO0002 Reserved for concepts with insufficient information to code with codable children: Secondary | ICD-10-CM

## 2012-07-15 DIAGNOSIS — Z3480 Encounter for supervision of other normal pregnancy, unspecified trimester: Secondary | ICD-10-CM

## 2012-07-15 NOTE — Progress Notes (Signed)
Last night patient had severe cramping last night followed by some diarrhea.  She was just concerned and wanted reassurance.

## 2012-07-15 NOTE — Progress Notes (Signed)
No bleeding, or LOF-no true viral GE noted either-likely something she ate-U/s shows viable baby--Reassurance given

## 2012-07-15 NOTE — Patient Instructions (Signed)
Breastfeeding Deciding to breastfeed is one of the best choices you can make for you and your baby. The information that follows gives a brief overview of the benefits of breastfeeding as well as common topics surrounding breastfeeding. BENEFITS OF BREASTFEEDING For the baby  The first milk (colostrum) helps the baby's digestive system function better.   There are antibodies in the mother's milk that help the baby fight off infections.   The baby has a lower incidence of asthma, allergies, and sudden infant death syndrome (SIDS).   The nutrients in breast milk are better for the baby than infant formulas, and breast milk helps the baby's brain grow better.   Babies who breastfeed have less gas, colic, and constipation.  For the mother  Breastfeeding helps develop a very special bond between the mother and her baby.   Breastfeeding is convenient, always available at the correct temperature, and costs nothing.   Breastfeeding burns calories in the mother and helps her lose weight that was gained during pregnancy.   Breastfeeding makes the uterus contract back down to normal size faster and slows bleeding following delivery.   Breastfeeding mothers have a lower risk of developing breast cancer.  BREASTFEEDING FREQUENCY  A healthy, full-term baby may breastfeed as often as every hour or space his or her feedings to every 3 hours.   Watch your baby for signs of hunger. Nurse your baby if he or she shows signs of hunger. How often you nurse will vary from baby to baby.   Nurse as often as the baby requests, or when you feel the need to reduce the fullness of your breasts.   Awaken the baby if it has been 3 4 hours since the last feeding.   Frequent feeding will help the mother make more milk and will help prevent problems, such as sore nipples and engorgement of the breasts.  BABY'S POSITION AT THE BREAST  Whether lying down or sitting, be sure that the baby's tummy is  facing your tummy.   Support the breast with 4 fingers underneath the breast and the thumb above. Make sure your fingers are well away from the nipple and baby's mouth.   Stroke the baby's lips gently with your finger or nipple.   When the baby's mouth is open wide enough, place all of your nipple and as much of the areola as possible into your baby's mouth.   Pull the baby in close so the tip of the nose and the baby's cheeks touch the breast during the feeding.  FEEDINGS AND SUCTION  The length of each feeding varies from baby to baby and from feeding to feeding.   The baby must suck about 2 3 minutes for your milk to get to him or her. This is called a "let down." For this reason, allow the baby to feed on each breast as long as he or she wants. Your baby will end the feeding when he or she has received the right balance of nutrients.   To break the suction, put your finger into the corner of the baby's mouth and slide it between his or her gums before removing your breast from his or her mouth. This will help prevent sore nipples.  HOW TO TELL WHETHER YOUR BABY IS GETTING ENOUGH BREAST MILK. Wondering whether or not your baby is getting enough milk is a common concern among mothers. You can be assured that your baby is getting enough milk if:   Your baby is actively   sucking and you hear swallowing.   Your baby seems relaxed and satisfied after a feeding.   Your baby nurses at least 8 12 times in a 24 hour time period. Nurse your baby until he or she unlatches or falls asleep at the first breast (at least 10 20 minutes), then offer the second side.   Your baby is wetting 5 6 disposable diapers (6 8 cloth diapers) in a 24 hour period by 5 6 days of age.   Your baby is having at least 3 4 stools every 24 hours for the first 6 weeks. The stool should be soft and yellow.   Your baby should gain 4 7 ounces per week after he or she is 4 days old.   Your breasts feel softer  after nursing.  REDUCING BREAST ENGORGEMENT  In the first week after your baby is born, you may experience signs of breast engorgement. When breasts are engorged, they feel heavy, warm, full, and may be tender to the touch. You can reduce engorgement if you:   Nurse frequently, every 2 3 hours. Mothers who breastfeed early and often have fewer problems with engorgement.   Place light ice packs on your breasts for 10 20 minutes between feedings. This reduces swelling. Wrap the ice packs in a lightweight towel to protect your skin. Bags of frozen vegetables work well for this purpose.   Take a warm shower or apply warm, moist heat to your breast for 5 10 minutes just before each feeding. This increases circulation and helps the milk flow.   Gently massage your breast before and during the feeding. Using your finger tips, massage from the chest wall towards your nipple in a circular motion.   Make sure that the baby empties at least one breast at every feeding before switching sides.   Use a breast pump to empty the breasts if your baby is sleepy or not nursing well. You may also want to pump if you are returning to work oryou feel you are getting engorged.   Avoid bottle feeds, pacifiers, or supplemental feedings of water or juice in place of breastfeeding. Breast milk is all the food your baby needs. It is not necessary for your baby to have water or formula. In fact, to help your breasts make more milk, it is best not to give your baby supplemental feedings during the early weeks.   Be sure the baby is latched on and positioned properly while breastfeeding.   Wear a supportive bra, avoiding underwire styles.   Eat a balanced diet with enough fluids.   Rest often, relax, and take your prenatal vitamins to prevent fatigue, stress, and anemia.  If you follow these suggestions, your engorgement should improve in 24 48 hours. If you are still experiencing difficulty, call your  lactation consultant or caregiver.  CARING FOR YOURSELF Take care of your breasts  Bathe or shower daily.   Avoid using soap on your nipples.   Start feedings on your left breast at one feeding and on your right breast at the next feeding.   You will notice an increase in your milk supply 2 5 days after delivery. You may feel some discomfort from engorgement, which makes your breasts very firm and often tender. Engorgement "peaks" out within 24 48 hours. In the meantime, apply warm moist towels to your breasts for 5 10 minutes before feeding. Gentle massage and expression of some milk before feeding will soften your breasts, making it easier for your   baby to latch on.   Wear a well-fitting nursing bra, and air dry your nipples for a 3 4minutes after each feeding.   Only use cotton bra pads.   Only use pure lanolin on your nipples after nursing. You do not need to wash it off before feeding the baby again. Another option is to express a few drops of breast milk and gently massage it into your nipples.  Take care of yourself  Eat well-balanced meals and nutritious snacks.   Drinking milk, fruit juice, and water to satisfy your thirst (about 8 glasses a day).   Get plenty of rest.  Avoid foods that you notice affect the baby in a bad way.  SEEK MEDICAL CARE IF:   You have difficulty with breastfeeding and need help.   You have a hard, red, sore area on your breast that is accompanied by a fever.   Your baby is too sleepy to eat well or is having trouble sleeping.   Your baby is wetting less than 6 diapers a day, by 5 days of age.   Your baby's skin or white part of his or her eyes is more yellow than it was in the hospital.   You feel depressed.  Document Released: 06/11/2005 Document Revised: 12/11/2011 Document Reviewed: 09/09/2011 [employer] Patient Information 2013 [employer].  

## 2012-07-30 ENCOUNTER — Ambulatory Visit (HOSPITAL_COMMUNITY)
Admission: RE | Admit: 2012-07-30 | Discharge: 2012-07-30 | Disposition: A | Discharge status: Home / Self Care | Payer: BC Managed Care – PPO | Source: Ambulatory Visit | Attending: Family Medicine | Admitting: Family Medicine

## 2012-07-30 VITALS — BP 123/81 | HR 110 | Wt 157.5 lb

## 2012-07-30 DIAGNOSIS — IMO0002 Reserved for concepts with insufficient information to code with codable children: Secondary | ICD-10-CM

## 2012-07-30 DIAGNOSIS — Z3490 Encounter for supervision of normal pregnancy, unspecified, unspecified trimester: Secondary | ICD-10-CM

## 2012-07-30 DIAGNOSIS — Z3689 Encounter for other specified antenatal screening: Secondary | ICD-10-CM | POA: Insufficient documentation

## 2012-07-30 DIAGNOSIS — O09529 Supervision of elderly multigravida, unspecified trimester: Secondary | ICD-10-CM | POA: Insufficient documentation

## 2012-07-30 DIAGNOSIS — O4400 Placenta previa specified as without hemorrhage, unspecified trimester: Secondary | ICD-10-CM

## 2012-07-30 DIAGNOSIS — N9489 Other specified conditions associated with female genital organs and menstrual cycle: Secondary | ICD-10-CM

## 2012-07-30 NOTE — Progress Notes (Signed)
Whitney Contreras  was seen today for an ultrasound appointment.  See full report in [facility].  Impression: Single IUP at 17 5/7 weeks Normal detailed fetal anatomy No markers associated with aneuploidy noted An 8.8 x 5.1 x 6.1 cm mostly cystic right adnexal mass is noted with some internal septations Posterior placenta previa  Recommendations: Recommend follow-up ultrasound examination in 6 weeks to reevaluate placental location and adnexal mass.  Alpha Gula, MD

## 2012-07-31 ENCOUNTER — Telehealth: Payer: Self-pay | Admitting: *Deleted

## 2012-07-31 NOTE — Telephone Encounter (Signed)
Patient wanted to call to let us know her ultrasound showed placenta previa.  I have advised her no strenuous activity and nothing in the vagina until she follows up here for further discussion of her particular situation.  She understands and is reassured.

## 2012-08-05 ENCOUNTER — Ambulatory Visit (INDEPENDENT_AMBULATORY_CARE_PROVIDER_SITE_OTHER): Payer: BC Managed Care – PPO | Admitting: Family Medicine

## 2012-08-05 VITALS — BP 117/70 | Wt 158.0 lb

## 2012-08-05 DIAGNOSIS — D279 Benign neoplasm of unspecified ovary: Secondary | ICD-10-CM | POA: Insufficient documentation

## 2012-08-05 DIAGNOSIS — Z3480 Encounter for supervision of other normal pregnancy, unspecified trimester: Secondary | ICD-10-CM

## 2012-08-05 DIAGNOSIS — O4400 Placenta previa specified as without hemorrhage, unspecified trimester: Secondary | ICD-10-CM

## 2012-08-05 DIAGNOSIS — N9489 Other specified conditions associated with female genital organs and menstrual cycle: Secondary | ICD-10-CM

## 2012-08-05 DIAGNOSIS — Z3490 Encounter for supervision of normal pregnancy, unspecified, unspecified trimester: Secondary | ICD-10-CM

## 2012-08-05 NOTE — Patient Instructions (Signed)
Pregnancy - Second Trimester The second trimester of pregnancy (3 to 6 months) is a period of rapid growth for you and your baby. At the end of the sixth month, your baby is about 9 inches long and weighs 1 1/2 pounds. You will begin to feel the baby move between 18 and 20 weeks of the pregnancy. This is called quickening. Weight gain is faster. A clear fluid (colostrum) may leak out of your breasts. You may feel small contractions of the womb (uterus). This is known as false labor or Braxton-Hicks contractions. This is like a practice for labor when the baby is ready to be born. Usually, the problems with morning sickness have usually passed by the end of your first trimester. Some women develop small dark blotches (called cholasma, mask of pregnancy) on their face that usually goes away after the baby is born. Exposure to the sun makes the blotches worse. Acne may also develop in some pregnant women and pregnant women who have acne, may find that it goes away. PRENATAL EXAMS  Blood work may continue to be done during prenatal exams. These tests are done to check on your health and the probable health of your baby. Blood work is used to follow your blood levels (hemoglobin). Anemia (low hemoglobin) is common during pregnancy. Iron and vitamins are given to help prevent this. You will also be checked for diabetes between 24 and 28 weeks of the pregnancy. Some of the previous blood tests may be repeated.  The size of the uterus is measured during each visit. This is to make sure that the baby is continuing to grow properly according to the dates of the pregnancy.  Your blood pressure is checked every prenatal visit. This is to make sure you are not getting toxemia.  Your urine is checked to make sure you do not have an infection, diabetes or protein in the urine.  Your weight is checked often to make sure gains are happening at the suggested rate. This is to ensure that both you and your baby are growing  normally.  Sometimes, an ultrasound is performed to confirm the proper growth and development of the baby. This is a test which bounces harmless sound waves off the baby so your caregiver can more accurately determine due dates. Sometimes, a specialized test is done on the amniotic fluid surrounding the baby. This test is called an amniocentesis. The amniotic fluid is obtained by sticking a needle into the belly (abdomen). This is done to check the chromosomes in instances where there is a concern about possible genetic problems with the baby. It is also sometimes done near the end of pregnancy if an early delivery is required. In this case, it is done to help make sure the baby's lungs are mature enough for the baby to live outside of the womb. CHANGES OCCURING IN THE SECOND TRIMESTER OF PREGNANCY Your body goes through many changes during pregnancy. They vary from person to person. Talk to your caregiver about changes you notice that you are concerned about.  During the second trimester, you will likely have an increase in your appetite. It is normal to have cravings for certain foods. This varies from person to person and pregnancy to pregnancy.  Your lower abdomen will begin to bulge.  You may have to urinate more often because the uterus and baby are pressing on your bladder. It is also common to get more bladder infections during pregnancy (pain with urination). You can help this by   drinking lots of fluids and emptying your bladder before and after intercourse.  You may begin to get stretch marks on your hips, abdomen, and breasts. These are normal changes in the body during pregnancy. There are no exercises or medications to take that prevent this change.  You may begin to develop swollen and bulging veins (varicose veins) in your legs. Wearing support hose, elevating your feet for 15 minutes, 3 to 4 times a day and limiting salt in your diet helps lessen the problem.  Heartburn may develop  as the uterus grows and pushes up against the stomach. Antacids recommended by your caregiver helps with this problem. Also, eating smaller meals 4 to 5 times a day helps.  Constipation can be treated with a stool softener or adding bulk to your diet. Drinking lots of fluids, vegetables, fruits, and whole grains are helpful.  Exercising is also helpful. If you have been very active up until your pregnancy, most of these activities can be continued during your pregnancy. If you have been less active, it is helpful to start an exercise program such as walking.  Hemorrhoids (varicose veins in the rectum) may develop at the end of the second trimester. Warm sitz baths and hemorrhoid cream recommended by your caregiver helps hemorrhoid problems.  Backaches may develop during this time of your pregnancy. Avoid heavy lifting, wear low heal shoes and practice good posture to help with backache problems.  Some pregnant women develop tingling and numbness of their hand and fingers because of swelling and tightening of ligaments in the wrist (carpel tunnel syndrome). This goes away after the baby is born.  As your breasts enlarge, you may have to get a bigger bra. Get a comfortable, cotton, support bra. Do not get a nursing bra until the last month of the pregnancy if you will be nursing the baby.  You may get a dark line from your belly button to the pubic area called the linea nigra.  You may develop rosy cheeks because of increase blood flow to the face.  You may develop spider looking lines of the face, neck, arms and chest. These go away after the baby is born. HOME CARE INSTRUCTIONS   It is extremely important to avoid all smoking, herbs, alcohol, and unprescribed drugs during your pregnancy. These chemicals affect the formation and growth of the baby. Avoid these chemicals throughout the pregnancy to ensure the delivery of a healthy infant.  Most of your home care instructions are the same as  suggested for the first trimester of your pregnancy. Keep your caregiver's appointments. Follow your caregiver's instructions regarding medication use, exercise and diet.  During pregnancy, you are providing food for you and your baby. Continue to eat regular, well-balanced meals. Choose foods such as meat, fish, milk and other low fat dairy products, vegetables, fruits, and whole-grain breads and cereals. Your caregiver will tell you of the ideal weight gain.  A physical sexual relationship may be continued up until near the end of pregnancy if there are no other problems. Problems could include early (premature) leaking of amniotic fluid from the membranes, vaginal bleeding, abdominal pain, or other medical or pregnancy problems.  Exercise regularly if there are no restrictions. Check with your caregiver if you are unsure of the safety of some of your exercises. The greatest weight gain will occur in the last 2 trimesters of pregnancy. Exercise will help you:  Control your weight.  Get you in shape for labor and delivery.  Lose weight   after you have the baby.  Wear a good support or jogging bra for breast tenderness during pregnancy. This may help if worn during sleep. Pads or tissues may be used in the bra if you are leaking colostrum.  Do not use hot tubs, steam rooms or saunas throughout the pregnancy.  Wear your seat belt at all times when driving. This protects you and your baby if you are in an accident.  Avoid raw meat, uncooked cheese, cat litter boxes and soil used by cats. These carry germs that can cause birth defects in the baby.  The second trimester is also a good time to visit your dentist for your dental health if this has not been done yet. Getting your teeth cleaned is OK. Use a soft toothbrush. Brush gently during pregnancy.  It is easier to loose urine during pregnancy. Tightening up and strengthening the pelvic muscles will help with this problem. Practice stopping your  urination while you are going to the bathroom. These are the same muscles you need to strengthen. It is also the muscles you would use as if you were trying to stop from passing gas. You can practice tightening these muscles up 10 times a set and repeating this about 3 times per day. Once you know what muscles to tighten up, do not perform these exercises during urination. It is more likely to contribute to an infection by backing up the urine.  Ask for help if you have financial, counseling or nutritional needs during pregnancy. Your caregiver will be able to offer counseling for these needs as well as refer you for other special needs.  Your skin may become oily. If so, wash your face with mild soap, use non-greasy moisturizer and oil or cream based makeup. MEDICATIONS AND DRUG USE IN PREGNANCY  Take prenatal vitamins as directed. The vitamin should contain 1 milligram of folic acid. Keep all vitamins out of reach of children. Only a couple vitamins or tablets containing iron may be fatal to a baby or young child when ingested.  Avoid use of all medications, including herbs, over-the-counter medications, not prescribed or suggested by your caregiver. Only take over-the-counter or prescription medicines for pain, discomfort, or fever as directed by your caregiver. Do not use aspirin.  Let your caregiver also know about herbs you may be using.  Alcohol is related to a number of birth defects. This includes fetal alcohol syndrome. All alcohol, in any form, should be avoided completely. Smoking will cause low birth rate and premature babies.  Street or illegal drugs are very harmful to the baby. They are absolutely forbidden. A baby born to an addicted mother will be addicted at birth. The baby will go through the same withdrawal an adult does. SEEK MEDICAL CARE IF:  You have any concerns or worries during your pregnancy. It is better to call with your questions if you feel they cannot wait, rather  than worry about them. SEEK IMMEDIATE MEDICAL CARE IF:   An unexplained oral temperature above 102 F (38.9 C) develops, or as your caregiver suggests.  You have leaking of fluid from the vagina (birth canal). If leaking membranes are suspected, take your temperature and tell your caregiver of this when you call.  There is vaginal spotting, bleeding, or passing clots. Tell your caregiver of the amount and how many pads are used. Light spotting in pregnancy is common, especially following intercourse.  You develop a bad smelling vaginal discharge with a change in the color from clear   to white.  You continue to feel sick to your stomach (nauseated) and have no relief from remedies suggested. You vomit blood or coffee ground-like materials.  You lose more than 2 pounds of weight or gain more than 2 pounds of weight over 1 week, or as suggested by your caregiver.  You notice swelling of your face, hands, feet, or legs.  You get exposed to German measles and have never had them.  You are exposed to fifth disease or chickenpox.  You develop belly (abdominal) pain. Round ligament discomfort is a common non-cancerous (benign) cause of abdominal pain in pregnancy. Your caregiver still must evaluate you.  You develop a bad headache that does not go away.  You develop fever, diarrhea, pain with urination, or shortness of breath.  You develop visual problems, blurry, or double vision.  You fall or are in a car accident or any kind of trauma.  There is mental or physical violence at home. Document Released: 06/05/2001 Document Revised: 09/03/2011 Document Reviewed: 12/08/2008 [employer] Patient Information 2013 [employer].  

## 2012-08-05 NOTE — Progress Notes (Signed)
Discussed her right cyst--will likely address pp Has previa--to re-eval in 6 wks.

## 2012-08-08 ENCOUNTER — Encounter: Payer: BC Managed Care – PPO | Admitting: Obstetrics and Gynecology

## 2012-08-14 ENCOUNTER — Encounter: Payer: BC Managed Care – PPO | Admitting: Family Medicine

## 2012-08-25 ENCOUNTER — Other Ambulatory Visit: Payer: Self-pay | Admitting: Advanced Practice Midwife

## 2012-08-25 ENCOUNTER — Inpatient Hospital Stay (HOSPITAL_COMMUNITY)
Admission: AD | Admit: 2012-08-25 | Discharge: 2012-08-25 | Disposition: A | Discharge status: Home / Self Care | Payer: BC Managed Care – PPO | Source: Ambulatory Visit | Attending: Family Medicine | Admitting: Family Medicine

## 2012-08-25 ENCOUNTER — Telehealth: Payer: Self-pay | Admitting: *Deleted

## 2012-08-25 ENCOUNTER — Encounter (HOSPITAL_COMMUNITY): Payer: Self-pay | Admitting: *Deleted

## 2012-08-25 ENCOUNTER — Inpatient Hospital Stay (HOSPITAL_COMMUNITY): Payer: BC Managed Care – PPO

## 2012-08-25 DIAGNOSIS — O09529 Supervision of elderly multigravida, unspecified trimester: Secondary | ICD-10-CM | POA: Insufficient documentation

## 2012-08-25 DIAGNOSIS — O4440 Low lying placenta NOS or without hemorrhage, unspecified trimester: Secondary | ICD-10-CM

## 2012-08-25 DIAGNOSIS — M549 Dorsalgia, unspecified: Secondary | ICD-10-CM | POA: Insufficient documentation

## 2012-08-25 DIAGNOSIS — O99891 Other specified diseases and conditions complicating pregnancy: Secondary | ICD-10-CM | POA: Insufficient documentation

## 2012-08-25 LAB — URINE MICROSCOPIC-ADD ON

## 2012-08-25 LAB — CBC
HCT: 30.5 % — ABNORMAL LOW (ref 36.0–46.0)
MCV: 89.4 fL (ref 78.0–100.0)
RBC: 3.41 MIL/uL — ABNORMAL LOW (ref 3.87–5.11)
WBC: 11.6 10*3/uL — ABNORMAL HIGH (ref 4.0–10.5)

## 2012-08-25 LAB — URINALYSIS, ROUTINE W REFLEX MICROSCOPIC
Glucose, UA: NEGATIVE mg/dL
Protein, ur: NEGATIVE mg/dL
Specific Gravity, Urine: 1.01 (ref 1.005–1.030)

## 2012-08-25 MED ORDER — CYCLOBENZAPRINE HCL 5 MG PO TABS: 5.0000 mg | ORAL_TABLET | Freq: Three times a day (TID) | ORAL | Status: DC | PRN

## 2012-08-25 NOTE — MAU Provider Note (Signed)
Chief Complaint:  Abdominal Pain and Back Pain   First Provider Initiated Contact with Patient 08/25/12 1057      HPI: Whitney Contreras is a 37 y.o. [id number] at [redacted]w[redacted]d who presents to maternity admissions reporting back pain. She reports that she spent the day yesterday doing yard work, after which she laid down for a little while, and when she got up a short time later she had significant back pain, 8/10. The pain is right-sided, sharp, wraps around her right flank, radiates into her abdomen, and made worse with movement. It is a little better today and is more dull, achy, constant, and not as bad with movement. She has a history of a R-sided ovarian cyst that is being monitored. She is also on her feet a lot for her job.   ROS She denies dysuria, fever/chills, chest pain, or dyspnea. No contractions.   Denies contractions, leakage of fluid or vaginal bleeding. Good fetal movement.   Pregnancy Course:   Past Medical History: Past Medical History  Diagnosis Date  . 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 HSV-1 infection     Valtrex PRN  . Endometriosis   . Miscarriage     x2  . History of abnormal cervical Pap smear   . Anxiety   . Anemia     during pregnancy    Past obstetric history: OB History   Grav Para Term Preterm Abortions TAB SAB Ect Mult Living   4 1 1  0 2 0 2 0 0 1     # Outc Date GA Lbr Len/2nd Wgt Sex Del Anes PTL Lv   1 SAB 2005    U   No No   2 TRM 7/09 [redacted]w[redacted]d  4.540JW(1XB1YN) M VAC Spinal No Yes   Comments: R femoral neuropathy post delivery in 2009/still has twinges in that R leg.   3 SAB            Comments: System Generated. Please review and update pregnancy details.   4 CUR               Past Surgical History: Past Surgical History  Procedure Laterality Date  . Foot surgery      buion remove  . Laparoscopy    . Wisdom tooth extraction      x4    Family History: Family History  Problem Relation Age of Onset  . Adopted: Yes     Social History: History  Substance Use Topics  . Smoking status: Former Games developer  . Smokeless tobacco: Never Used  . Alcohol Use: No    Allergies: No Known Allergies  Meds:  Prescriptions prior to admission  Medication Sig Dispense Refill  . Prenatal Vit-Fe Fumarate-FA (PRENATAL MULTIVITAMIN) TABS Take 1 tablet by mouth daily at 12 noon.        ROS: Pertinent findings in history of present illness.  Physical Exam  Blood pressure 122/71, pulse 106, temperature 97.7 F (36.5 C), temperature source Oral, resp. rate 18, height 5\' 4"  (1.626 m), weight 72.848 kg (160 lb 9.6 oz), last menstrual period 03/28/2012. GENERAL: Well-developed, well-nourished female in no acute distress.  HEENT: normocephalic HEART: normal rate RESP: normal effort ABDOMEN: Soft, non-tender, gravid appropriate for gestational age BACK: no tenderness to palpation of paraspinal muscles EXTREMITIES: Nontender, no edema NEURO: alert and oriented SPECULUM EXAM: Deferred     FHT:  152 by doppler   Labs: Results for orders placed during the hospital encounter of  08/25/12 (from the past 24 hour(s))  URINALYSIS, ROUTINE W REFLEX MICROSCOPIC     Status: Abnormal   Collection Time    08/25/12  9:35 AM      Result Value Range   Color, Urine YELLOW  YELLOW   APPearance CLOUDY (*) CLEAR   Specific Gravity, Urine 1.010  1.005 - 1.030   pH 7.0  5.0 - 8.0   Glucose, UA NEGATIVE  NEGATIVE mg/dL   Hgb urine dipstick TRACE (*) NEGATIVE   Bilirubin Urine NEGATIVE  NEGATIVE   Ketones, ur NEGATIVE  NEGATIVE mg/dL   Protein, ur NEGATIVE  NEGATIVE mg/dL   Urobilinogen, UA 0.2  0.0 - 1.0 mg/dL   Nitrite NEGATIVE  NEGATIVE   Leukocytes, UA NEGATIVE  NEGATIVE  URINE MICROSCOPIC-ADD ON     Status: Abnormal   Collection Time    08/25/12  9:35 AM      Result Value Range   Squamous Epithelial / LPF MANY (*) RARE   WBC, UA 0-2  <3 WBC/hpf   RBC / HPF 0-2  <3 RBC/hpf   Bacteria, UA MANY (*) RARE   Urine-Other  AMORPHOUS URATES/PHOSPHATES    CBC     Status: Abnormal   Collection Time    08/25/12 11:17 AM      Result Value Range   WBC 11.6 (*) 4.0 - 10.5 K/uL   RBC 3.41 (*) 3.87 - 5.11 MIL/uL   Hemoglobin 10.3 (*) 12.0 - 15.0 g/dL   HCT 40.9 (*) 81.1 - 91.4 %   MCV 89.4  78.0 - 100.0 fL   MCH 30.2  26.0 - 34.0 pg   MCHC 33.8  30.0 - 36.0 g/dL   RDW 78.2  95.6 - 21.3 %   Platelets 236  150 - 400 K/uL    Imaging:     Assessment: 1. Back pain complicating pregnancy, second trimester   2. Musculoskeletal pain   Placenta previa resolved and now low-lying, 1 cm above os  Plan: Discharge home PTL precautions Flexeril 5 mg TID PRN back pain Letter for pt to be out of work x2 days Rest, heat, ice, Tylenol for pain Discussed placental location with Dr Graciela Husbands pelvic rest, other precautions r/t previa, bleeding precautions given F/U ultrasound in 4 weeks from today to eval adnexal mass and placental location F/U at [facility] or return to MAU as needed   Follow-up Information   Follow up with [facility] [place]. (Keep scheduled appointments.  U/S in 4-6 weeks.  Return to MAU as needed.)    Contact information:   [address] [place] [address] [phone]       Sharen Counter Certified Nurse-Midwife 08/25/2012 1:17 PM

## 2012-08-25 NOTE — MAU Note (Signed)
Hx of ovarian cyst, since last night has had severe Lower back pain on R which radiates into RLQ.  Sharper with sudden movements.

## 2012-08-25 NOTE — MAU Provider Note (Signed)
Chart reviewed and agree with management and plan.  

## 2012-08-25 NOTE — Progress Notes (Signed)
U/S performed while pt in MAU on 08/25/12, so f/u ultrasound scheduled for 4 weeks from today per Dr Shawnie Pons.

## 2012-08-25 NOTE — Telephone Encounter (Signed)
Patient is having severe back pain and lower pelvic pain since yesterday, it is bad enough that it is making her nauseated and kept her from sleeping at night.  I have advised her to proceed to MAU to be evaluated and she agrees.

## 2012-08-25 NOTE — MAU Note (Signed)
Patient states she started having right flank and lower right abdominal pain last night. Denies bleeding or leaking and has felt fetal movement.

## 2012-08-28 ENCOUNTER — Inpatient Hospital Stay (HOSPITAL_COMMUNITY): Payer: BC Managed Care – PPO

## 2012-08-28 ENCOUNTER — Inpatient Hospital Stay (HOSPITAL_COMMUNITY)
Admission: AD | Admit: 2012-08-28 | Discharge: 2012-08-28 | Disposition: A | Discharge status: Home / Self Care | Payer: BC Managed Care – PPO | Source: Ambulatory Visit | Attending: Obstetrics & Gynecology | Admitting: Obstetrics & Gynecology

## 2012-08-28 ENCOUNTER — Encounter (HOSPITAL_COMMUNITY): Payer: Self-pay

## 2012-08-28 ENCOUNTER — Telehealth: Payer: Self-pay | Admitting: *Deleted

## 2012-08-28 DIAGNOSIS — R109 Unspecified abdominal pain: Secondary | ICD-10-CM | POA: Insufficient documentation

## 2012-08-28 DIAGNOSIS — N83209 Unspecified ovarian cyst, unspecified side: Secondary | ICD-10-CM | POA: Insufficient documentation

## 2012-08-28 HISTORY — DX: Unspecified abnormal cytological findings in specimens from cervix uteri: R87.619

## 2012-08-28 HISTORY — DX: Reserved for concepts with insufficient information to code with codable children: IMO0002

## 2012-08-28 LAB — URINALYSIS, ROUTINE W REFLEX MICROSCOPIC
Leukocytes, UA: NEGATIVE
Protein, ur: NEGATIVE mg/dL
Urobilinogen, UA: 0.2 mg/dL (ref 0.0–1.0)

## 2012-08-28 LAB — URINE MICROSCOPIC-ADD ON

## 2012-08-28 MED ORDER — OXYCODONE-ACETAMINOPHEN 5-325 MG PO TABS: 1.0000 | ORAL_TABLET | ORAL | Status: DC | PRN

## 2012-08-28 NOTE — MAU Provider Note (Signed)
History   Pt is a 37 y/o G4P1021 with hx of R ovarian cyst presenting with intermittent R sided abdominal pain.    CSN: [id number]  Arrival date and time: 08/28/12 4098   First Provider Initiated Contact with Patient 08/28/12 1010      Chief Complaint  Patient presents with  . Abdominal Pain  . Back Pain   Abdominal Pain Associated symptoms include constipation. Pertinent negatives include no diarrhea, dysuria, fever, frequency, headaches, hematuria, myalgias, nausea or vomiting.  Back Pain Associated symptoms include abdominal pain. Pertinent negatives include no chest pain, dysuria, fever, headaches or tingling.    # R sided abdominal pain: Pt mentions 9/10 intermittent cramping R sided abdominal pain from her spine to midline abdomen that lasts for approximately 30 min every 1-2 hours since Sunday 08/24/12.  Pt has been seen here on 08/25/12 with similar complaint found to have a negative U/S and UA tx with flexaril and heating pad with little to no relief.  Pt mentions continued fetal movement, denies vaginal discharge, fluid leackage or blood, blood in her urine, pain with urination, fever, n/v/d/c.  Pt has tried flexaril and heating pad with little to no relief.  Denies rash or blisters over corresponding area, leg weakness, numbness or tingling or pain that radiated below the knee, pain does not cross midline.   08/24/12 Visit hx: U/S 08/24/12: Single IUP GA of [redacted]w[redacted]d.  R adnexal 7.5 cm cyst w/ echogenic foci along the periphery.  Cyst present since 2012, possible endometrioma. U/A, positive trace Hbg, negative for leukocytes or nitrite,   Pertinent Gynecological History: Menses: Pregnang Bleeding: Denies Blood transfusions: none Sexually transmitted diseases: no past history Previous GYN Procedures: laproscopy for endometriosis  Last mammogram: unknown Date: unknown Last pap: normal Date: unknown     Past Medical History  Diagnosis Date  . Femoral neuropathy 2009    post delivery  right leg  . HSV-1 infection     Valtrex PRN  . Endometriosis   . Miscarriage     x2  . History of abnormal cervical Pap smear   . Anxiety   . Anemia     during pregnancy  . Abnormal Pap smear     Past Surgical History  Procedure Laterality Date  . Foot surgery      buion remove  . Laparoscopy    . Wisdom tooth extraction      x4    Family History  Problem Relation Age of Onset  . Adopted: Yes    History  Substance Use Topics  . Smoking status: Former Games developer  . Smokeless tobacco: Never Used  . Alcohol Use: No    Allergies: No Known Allergies  Prescriptions prior to admission  Medication Sig Dispense Refill  . cyclobenzaprine (FLEXERIL) 5 MG tablet Take 1 tablet (5 mg total) by mouth 3 (three) times daily as needed for muscle spasms.  30 tablet  1  . Prenatal Vit-Fe Fumarate-FA (PRENATAL MULTIVITAMIN) TABS Take 1 tablet by mouth at bedtime.         Review of Systems  Constitutional: Negative for fever and chills.  HENT: Negative for ear pain, congestion and sore throat.   Eyes: Negative for blurred vision and double vision.  Respiratory: Negative for cough, hemoptysis and wheezing.   Cardiovascular: Negative for chest pain and palpitations.  Gastrointestinal: Positive for abdominal pain and constipation. Negative for heartburn, nausea, vomiting, diarrhea and blood in stool.  Genitourinary: Positive for flank pain (See HPI). Negative for dysuria, urgency,  frequency and hematuria.  Musculoskeletal: Positive for back pain. Negative for myalgias and joint pain.  Skin: Negative for itching and rash.       Negative for blisters, or tingling  Neurological: Negative for dizziness, tingling and headaches.  Endo/Heme/Allergies: Does not bruise/bleed easily.  Psychiatric/Behavioral: Negative for depression and substance abuse.  All other systems reviewed and are negative.   Physical Exam   Blood pressure 130/70, pulse 108, temperature 97.8 F (36.6 C), temperature  source Oral, resp. rate 18, last menstrual period 03/28/2012, SpO2 100.00%.  Physical Exam  Constitutional: She is oriented to person, place, and time. She appears well-developed and well-nourished.  HENT:  Head: Normocephalic.  Eyes: Conjunctivae and EOM are normal. Pupils are equal, round, and reactive to light.  Neck: Normal range of motion. Neck supple.  Cardiovascular: Normal rate, regular rhythm, normal heart sounds and intact distal pulses.  Exam reveals no gallop and no friction rub.   No murmur heard. Respiratory: Effort normal and breath sounds normal. No respiratory distress. She has no wheezes. She has no rales.  GI: Soft. Bowel sounds are normal. She exhibits no distension. There is no tenderness. There is no rebound, no guarding and no CVA tenderness.  Genitourinary:  Deferred  Musculoskeletal: Normal range of motion.       Arms: Neurological: She is alert and oriented to person, place, and time. She has normal reflexes.  Reflex Scores:      Patellar reflexes are 2+ on the right side and 2+ on the left side.      Achilles reflexes are 2+ on the right side and 2+ on the left side. Negative straight or cross leg raise   Skin: Skin is warm and dry. No lesion and no rash noted. Rash is not vesicular. No cyanosis or erythema. Nails show no clubbing.  Psychiatric: She has a normal mood and affect. Her behavior is normal. Judgment and thought content normal.    MAU Course  Procedures  MDM  Assessment and Plan  Pt is a 37 y/o G4P1021 with hx of R ovarian cyst presenting with intermittent R sided abdominal pain.  # Musculoskeletal pain: Pt mentions intermittent R sided abdominal and flank pain since 08/24/12 with no relief from flexaril.  Differential: Ovarian torsion: Intense intermittent R flank/ abdominal pain with hx of 7.5cm R adnexal endometrioma, U/S w/ color doppler to evaluate for blood flow.  Nephrolithiasis: intermittent R sided pain with trace blood in urine, possible  renal u/s evaluate for stones, pain control as needed.  Shingles: Pt mentions intense R sided flank pain that does not cross midline, but negative for erythema or vesicles over corresponding area.  Musculoskeletal pain: pt has increased muscle tone on R side compared to L, continue flexaril, heat prn.   Plan: -- transvaginal u/s r/o ovarian tosion -- percocet 5/325 for pain prn -- continue with clinic appointment as scheduled.    Gregor Hams 08/28/2012, 11:32 AM  Evaluation and management procedures were performed by Resident physician under my supervision/collaboration. Chart reviewed, patient examined by me and I agree with management and plan. Results for orders placed during the hospital encounter of 08/28/12 (from the past 24 hour(s))  URINALYSIS, ROUTINE W REFLEX MICROSCOPIC     Status: Abnormal   Collection Time    08/28/12  9:30 AM      Result Value Range   Color, Urine YELLOW  YELLOW   APPearance HAZY (*) CLEAR   Specific Gravity, Urine <1.005 (*) 1.005 - 1.030  pH 6.0  5.0 - 8.0   Glucose, UA NEGATIVE  NEGATIVE mg/dL   Hgb urine dipstick TRACE (*) NEGATIVE   Bilirubin Urine NEGATIVE  NEGATIVE   Ketones, ur NEGATIVE  NEGATIVE mg/dL   Protein, ur NEGATIVE  NEGATIVE mg/dL   Urobilinogen, UA 0.2  0.0 - 1.0 mg/dL   Nitrite NEGATIVE  NEGATIVE   Leukocytes, UA NEGATIVE  NEGATIVE  URINE MICROSCOPIC-ADD ON     Status: Abnormal   Collection Time    08/28/12  9:30 AM      Result Value Range   Squamous Epithelial / LPF MANY (*) RARE   WBC, UA 0-2  <3 WBC/hpf   RBC / HPF 0-2  <3 RBC/hpf   Bacteria, UA FEW (*) RARE  Danae Orleans, CNM 08/28/2012 11:39 AM

## 2012-08-28 NOTE — MAU Note (Signed)
Patient is in with c/o persistent lower back pain that radiates to her right lower quadrant. Patient describes her pain as constant but have intermittent burst of intensity. She states that the pain is sharp and shooting. Patient denies vaginal bleeding or lof. She reports good fetal movement. Patient states that she was seen on Monday and was told that the pain is possibly muscle skeletal. Flexeril was prescribed. Patient states that she have no relief from flexeril.

## 2012-08-28 NOTE — Progress Notes (Signed)
[name] cnm notified of patient and her c/o persistent lower back pain that radiates to the right lower quadrant. A known 7cm cyst. Patient have no relief from flexeril

## 2012-08-28 NOTE — MAU Note (Signed)
Patient states she was seen in the MAU on Monday and has a cyst on the right side and was treated for muscle strain. Patient states the pain on the right side and in right flank was so bad she almost passed out.

## 2012-09-03 ENCOUNTER — Ambulatory Visit (INDEPENDENT_AMBULATORY_CARE_PROVIDER_SITE_OTHER): Payer: BC Managed Care – PPO | Admitting: Family Medicine

## 2012-09-03 ENCOUNTER — Encounter: Payer: Self-pay | Admitting: Family Medicine

## 2012-09-03 VITALS — BP 120/70 | Wt 162.6 lb

## 2012-09-03 DIAGNOSIS — N9489 Other specified conditions associated with female genital organs and menstrual cycle: Secondary | ICD-10-CM

## 2012-09-03 DIAGNOSIS — Z3480 Encounter for supervision of other normal pregnancy, unspecified trimester: Secondary | ICD-10-CM

## 2012-09-03 DIAGNOSIS — O4410 Placenta previa with hemorrhage, unspecified trimester: Secondary | ICD-10-CM

## 2012-09-03 NOTE — Progress Notes (Signed)
Back pain is improving with rest and heat.   Previa has resolved.  Will recheck in 3rd trimester. 28 wk labs next visit.

## 2012-09-03 NOTE — Patient Instructions (Signed)
Breastfeeding Deciding to breastfeed is one of the best choices you can make for you and your baby. The information that follows gives a brief overview of the benefits of breastfeeding as well as common topics surrounding breastfeeding. BENEFITS OF BREASTFEEDING For the baby  The first milk (colostrum) helps the baby's digestive system function better.   There are antibodies in the mother's milk that help the baby fight off infections.   The baby has a lower incidence of asthma, allergies, and sudden infant death syndrome (SIDS).   The nutrients in breast milk are better for the baby than infant formulas, and breast milk helps the baby's brain grow better.   Babies who breastfeed have less gas, colic, and constipation.  For the mother  Breastfeeding helps develop a very special bond between the mother and her baby.   Breastfeeding is convenient, always available at the correct temperature, and costs nothing.   Breastfeeding burns calories in the mother and helps her lose weight that was gained during pregnancy.   Breastfeeding makes the uterus contract back down to normal size faster and slows bleeding following delivery.   Breastfeeding mothers have a lower risk of developing breast cancer.  BREASTFEEDING FREQUENCY  A healthy, full-term baby may breastfeed as often as every hour or space his or her feedings to every 3 hours.   Watch your baby for signs of hunger. Nurse your baby if he or she shows signs of hunger. How often you nurse will vary from baby to baby.   Nurse as often as the baby requests, or when you feel the need to reduce the fullness of your breasts.   Awaken the baby if it has been 3 4 hours since the last feeding.   Frequent feeding will help the mother make more milk and will help prevent problems, such as sore nipples and engorgement of the breasts.  BABY'S POSITION AT THE BREAST  Whether lying down or sitting, be sure that the baby's tummy is  facing your tummy.   Support the breast with 4 fingers underneath the breast and the thumb above. Make sure your fingers are well away from the nipple and baby's mouth.   Stroke the baby's lips gently with your finger or nipple.   When the baby's mouth is open wide enough, place all of your nipple and as much of the areola as possible into your baby's mouth.   Pull the baby in close so the tip of the nose and the baby's cheeks touch the breast during the feeding.  FEEDINGS AND SUCTION  The length of each feeding varies from baby to baby and from feeding to feeding.   The baby must suck about 2 3 minutes for your milk to get to him or her. This is called a "let down." For this reason, allow the baby to feed on each breast as long as he or she wants. Your baby will end the feeding when he or she has received the right balance of nutrients.   To break the suction, put your finger into the corner of the baby's mouth and slide it between his or her gums before removing your breast from his or her mouth. This will help prevent sore nipples.  HOW TO TELL WHETHER YOUR BABY IS GETTING ENOUGH BREAST MILK. Wondering whether or not your baby is getting enough milk is a common concern among mothers. You can be assured that your baby is getting enough milk if:   Your baby is actively   sucking and you hear swallowing.   Your baby seems relaxed and satisfied after a feeding.   Your baby nurses at least 8 12 times in a 24 hour time period. Nurse your baby until he or she unlatches or falls asleep at the first breast (at least 10 20 minutes), then offer the second side.   Your baby is wetting 5 6 disposable diapers (6 8 cloth diapers) in a 24 hour period by 5 6 days of age.   Your baby is having at least 3 4 stools every 24 hours for the first 6 weeks. The stool should be soft and yellow.   Your baby should gain 4 7 ounces per week after he or she is 4 days old.   Your breasts feel softer  after nursing.  REDUCING BREAST ENGORGEMENT  In the first week after your baby is born, you may experience signs of breast engorgement. When breasts are engorged, they feel heavy, warm, full, and may be tender to the touch. You can reduce engorgement if you:   Nurse frequently, every 2 3 hours. Mothers who breastfeed early and often have fewer problems with engorgement.   Place light ice packs on your breasts for 10 20 minutes between feedings. This reduces swelling. Wrap the ice packs in a lightweight towel to protect your skin. Bags of frozen vegetables work well for this purpose.   Take a warm shower or apply warm, moist heat to your breast for 5 10 minutes just before each feeding. This increases circulation and helps the milk flow.   Gently massage your breast before and during the feeding. Using your finger tips, massage from the chest wall towards your nipple in a circular motion.   Make sure that the baby empties at least one breast at every feeding before switching sides.   Use a breast pump to empty the breasts if your baby is sleepy or not nursing well. You may also want to pump if you are returning to work oryou feel you are getting engorged.   Avoid bottle feeds, pacifiers, or supplemental feedings of water or juice in place of breastfeeding. Breast milk is all the food your baby needs. It is not necessary for your baby to have water or formula. In fact, to help your breasts make more milk, it is best not to give your baby supplemental feedings during the early weeks.   Be sure the baby is latched on and positioned properly while breastfeeding.   Wear a supportive bra, avoiding underwire styles.   Eat a balanced diet with enough fluids.   Rest often, relax, and take your prenatal vitamins to prevent fatigue, stress, and anemia.  If you follow these suggestions, your engorgement should improve in 24 48 hours. If you are still experiencing difficulty, call your  lactation consultant or caregiver.  CARING FOR YOURSELF Take care of your breasts  Bathe or shower daily.   Avoid using soap on your nipples.   Start feedings on your left breast at one feeding and on your right breast at the next feeding.   You will notice an increase in your milk supply 2 5 days after delivery. You may feel some discomfort from engorgement, which makes your breasts very firm and often tender. Engorgement "peaks" out within 24 48 hours. In the meantime, apply warm moist towels to your breasts for 5 10 minutes before feeding. Gentle massage and expression of some milk before feeding will soften your breasts, making it easier for your   baby to latch on.   Wear a well-fitting nursing bra, and air dry your nipples for a 3 4minutes after each feeding.   Only use cotton bra pads.   Only use pure lanolin on your nipples after nursing. You do not need to wash it off before feeding the baby again. Another option is to express a few drops of breast milk and gently massage it into your nipples.  Take care of yourself  Eat well-balanced meals and nutritious snacks.   Drinking milk, fruit juice, and water to satisfy your thirst (about 8 glasses a day).   Get plenty of rest.  Avoid foods that you notice affect the baby in a bad way.  SEEK MEDICAL CARE IF:   You have difficulty with breastfeeding and need help.   You have a hard, red, sore area on your breast that is accompanied by a fever.   Your baby is too sleepy to eat well or is having trouble sleeping.   Your baby is wetting less than 6 diapers a day, by 5 days of age.   Your baby's skin or white part of his or her eyes is more yellow than it was in the hospital.   You feel depressed.  Document Released: 06/11/2005 Document Revised: 12/11/2011 Document Reviewed: 09/09/2011 [employer] Patient Information 2013 [employer].  

## 2012-09-03 NOTE — Progress Notes (Signed)
P = 111  Pt was seen @ MAU on 3/3 and 3/6 for back pain- still having some but improving

## 2012-09-10 ENCOUNTER — Inpatient Hospital Stay (HOSPITAL_COMMUNITY): Admission: RE | Admit: 2012-09-10 | Payer: BC Managed Care – PPO | Source: Ambulatory Visit

## 2012-09-10 NOTE — Telephone Encounter (Signed)
Patient was advised to go to MAU for evaluation of pain.

## 2012-09-11 NOTE — MAU Provider Note (Signed)
Attestation of Attending Supervision of Advanced Practitioner (CNM/NP): Evaluation and management procedures were performed by the Advanced Practitioner under my supervision and collaboration.  I have reviewed the Advanced Practitioner's note and chart, and I agree with the management and plan.  HARRAWAY-SMITH, CAROLYN 2:32 PM     

## 2012-10-03 ENCOUNTER — Ambulatory Visit (INDEPENDENT_AMBULATORY_CARE_PROVIDER_SITE_OTHER): Payer: BC Managed Care – PPO | Admitting: Obstetrics & Gynecology

## 2012-10-03 VITALS — BP 121/72 | Wt 166.0 lb

## 2012-10-03 DIAGNOSIS — N9489 Other specified conditions associated with female genital organs and menstrual cycle: Secondary | ICD-10-CM

## 2012-10-03 DIAGNOSIS — O4413 Placenta previa with hemorrhage, third trimester: Secondary | ICD-10-CM

## 2012-10-03 DIAGNOSIS — Z3493 Encounter for supervision of normal pregnancy, unspecified, third trimester: Secondary | ICD-10-CM

## 2012-10-03 DIAGNOSIS — O09529 Supervision of elderly multigravida, unspecified trimester: Secondary | ICD-10-CM

## 2012-10-03 DIAGNOSIS — O09523 Supervision of elderly multigravida, third trimester: Secondary | ICD-10-CM

## 2012-10-03 DIAGNOSIS — Z3480 Encounter for supervision of other normal pregnancy, unspecified trimester: Secondary | ICD-10-CM

## 2012-10-03 DIAGNOSIS — Z23 Encounter for immunization: Secondary | ICD-10-CM

## 2012-10-03 DIAGNOSIS — O4410 Placenta previa with hemorrhage, unspecified trimester: Secondary | ICD-10-CM

## 2012-10-03 LAB — CBC
HCT: 32.3 % — ABNORMAL LOW (ref 36.0–46.0)
MCHC: 33.7 g/dL (ref 30.0–36.0)
MCV: 89.7 fL (ref 78.0–100.0)
RDW: 13.8 % (ref 11.5–15.5)

## 2012-10-03 MED ORDER — TETANUS-DIPHTH-ACELL PERTUSSIS 5-2.5-18.5 LF-MCG/0.5 IM SUSP: 0.5000 mL | Freq: Once | INTRAMUSCULAR | Status: DC

## 2012-10-03 NOTE — Patient Instructions (Signed)
Return to clinic for any obstetric concerns or go to [facility] for evaluation  

## 2012-10-03 NOTE — Progress Notes (Signed)
P = 107 

## 2012-10-03 NOTE — Progress Notes (Signed)
Third trimester labs today.  Unsure about contraception, considering having total hysterectomy/BSO after birth to treat her endometriosis. Discussed that she will need estrogen therapy afterwards.  Discussed other contraception modalities and different techniques for sterilization.  Patient also wants her 8 cm complex ovarian cyst removed.  TDaP vaccine counseling given, she received vaccine today.  Ultrasound ordered to follow up low lying placenta.  No other complaints or concerns.  Fetal movement and labor precautions reviewed.

## 2012-10-04 LAB — GLUCOSE TOLERANCE, 1 HOUR (50G) W/O FASTING: Glucose, 1 Hour GTT: 104 mg/dL (ref 70–140)

## 2012-10-07 ENCOUNTER — Encounter: Payer: Self-pay | Admitting: Obstetrics & Gynecology

## 2012-10-09 ENCOUNTER — Encounter: Payer: Self-pay | Admitting: Obstetrics & Gynecology

## 2012-10-09 ENCOUNTER — Ambulatory Visit (HOSPITAL_COMMUNITY)
Admission: RE | Admit: 2012-10-09 | Discharge: 2012-10-09 | Disposition: A | Discharge status: Home / Self Care | Payer: BC Managed Care – PPO | Source: Ambulatory Visit | Attending: Obstetrics & Gynecology | Admitting: Obstetrics & Gynecology

## 2012-10-09 DIAGNOSIS — O34599 Maternal care for other abnormalities of gravid uterus, unspecified trimester: Secondary | ICD-10-CM | POA: Insufficient documentation

## 2012-10-09 DIAGNOSIS — O3660X0 Maternal care for excessive fetal growth, unspecified trimester, not applicable or unspecified: Secondary | ICD-10-CM | POA: Insufficient documentation

## 2012-10-09 DIAGNOSIS — O4413 Placenta previa with hemorrhage, third trimester: Secondary | ICD-10-CM

## 2012-10-09 DIAGNOSIS — O09529 Supervision of elderly multigravida, unspecified trimester: Secondary | ICD-10-CM | POA: Insufficient documentation

## 2012-10-13 ENCOUNTER — Encounter: Payer: Self-pay | Admitting: *Deleted

## 2012-10-23 ENCOUNTER — Ambulatory Visit (INDEPENDENT_AMBULATORY_CARE_PROVIDER_SITE_OTHER): Payer: BC Managed Care – PPO | Admitting: Obstetrics & Gynecology

## 2012-10-23 VITALS — BP 117/70 | Wt 168.0 lb

## 2012-10-23 DIAGNOSIS — Z3480 Encounter for supervision of other normal pregnancy, unspecified trimester: Secondary | ICD-10-CM

## 2012-10-23 DIAGNOSIS — Z3493 Encounter for supervision of normal pregnancy, unspecified, third trimester: Secondary | ICD-10-CM

## 2012-10-23 NOTE — Progress Notes (Signed)
P - 114 

## 2012-10-23 NOTE — Progress Notes (Signed)
No other complaints or concerns.  Fetal movement and labor precautions reviewed.  

## 2012-10-23 NOTE — Patient Instructions (Signed)
Return to clinic for any obstetric concerns or go to [facility] for evaluation  

## 2012-11-05 ENCOUNTER — Ambulatory Visit (INDEPENDENT_AMBULATORY_CARE_PROVIDER_SITE_OTHER): Payer: BC Managed Care – PPO | Admitting: Family Medicine

## 2012-11-05 VITALS — BP 103/69 | Wt 169.0 lb

## 2012-11-05 DIAGNOSIS — Z3493 Encounter for supervision of normal pregnancy, unspecified, third trimester: Secondary | ICD-10-CM

## 2012-11-05 DIAGNOSIS — Z3480 Encounter for supervision of other normal pregnancy, unspecified trimester: Secondary | ICD-10-CM

## 2012-11-05 DIAGNOSIS — N9489 Other specified conditions associated with female genital organs and menstrual cycle: Secondary | ICD-10-CM

## 2012-11-05 NOTE — Progress Notes (Signed)
Feels very fatigued.  Nml BP and U/A, minimal weight gain.  Discussed ovarian cyst again, considering interval oophorectomy over Christmas break +/- salpingectomy.

## 2012-11-05 NOTE — Assessment & Plan Note (Signed)
Trying to take it easy.

## 2012-11-05 NOTE — Patient Instructions (Signed)
Pregnancy - Third Trimester The third trimester of pregnancy (the last 3 months) is a period of the most rapid growth for you and your baby. The baby approaches a length of 20 inches and a weight of 6 to 10 pounds. The baby is adding on fat and getting ready for life outside your body. While inside, babies have periods of sleeping and waking, suck their thumbs, and hiccups. You can often feel small contractions of the uterus. This is false labor. It is also called Braxton-Hicks contractions. This is like a practice for labor. The usual problems in this stage of pregnancy include more difficulty breathing, swelling of the hands and feet from water retention, and having to urinate more often because of the uterus and baby pressing on your bladder.  PRENATAL EXAMS  Blood work may continue to be done during prenatal exams. These tests are done to check on your health and the probable health of your baby. Blood work is used to follow your blood levels (hemoglobin). Anemia (low hemoglobin) is common during pregnancy. Iron and vitamins are given to help prevent this. You may also continue to be checked for diabetes. Some of the past blood tests may be done again.  The size of the uterus is measured during each visit. This makes sure your baby is growing properly according to your pregnancy dates.  Your blood pressure is checked every prenatal visit. This is to make sure you are not getting toxemia.  Your urine is checked every prenatal visit for infection, diabetes and protein.  Your weight is checked at each visit. This is done to make sure gains are happening at the suggested rate and that you and your baby are growing normally.  Sometimes, an ultrasound is performed to confirm the position and the proper growth and development of the baby. This is a test done that bounces harmless sound waves off the baby so your caregiver can more accurately determine due dates.  Discuss the type of pain medication  and anesthesia you will have during your labor and delivery.  Discuss the possibility and anesthesia if a Cesarean Section might be necessary.  Inform your caregiver if there is any mental or physical violence at home. Sometimes, a specialized non-stress test, contraction stress test and biophysical profile are done to make sure the baby is not having a problem. Checking the amniotic fluid surrounding the baby is called an amniocentesis. The amniotic fluid is removed by sticking a needle into the belly (abdomen). This is sometimes done near the end of pregnancy if an early delivery is required. In this case, it is done to help make sure the baby's lungs are mature enough for the baby to live outside of the womb. If the lungs are not mature and it is unsafe to deliver the baby, an injection of cortisone medication is given to the mother 1 to 2 days before the delivery. This helps the baby's lungs mature and makes it safer to deliver the baby. CHANGES OCCURING IN THE THIRD TRIMESTER OF PREGNANCY Your body goes through many changes during pregnancy. They vary from person to person. Talk to your caregiver about changes you notice and are concerned about.  During the last trimester, you have probably had an increase in your appetite. It is normal to have cravings for certain foods. This varies from person to person and pregnancy to pregnancy.  You may begin to get stretch marks on your hips, abdomen, and breasts. These are normal changes in the body   during pregnancy. There are no exercises or medications to take which prevent this change.  Constipation may be treated with a stool softener or adding bulk to your diet. Drinking lots of fluids, fiber in vegetables, fruits, and whole grains are helpful.  Exercising is also helpful. If you have been very active up until your pregnancy, most of these activities can be continued during your pregnancy. If you have been less active, it is helpful to start an  exercise program such as walking. Consult your caregiver before starting exercise programs.  Avoid all smoking, alcohol, un-prescribed drugs, herbs and "street drugs" during your pregnancy. These chemicals affect the formation and growth of the baby. Avoid chemicals throughout the pregnancy to ensure the delivery of a healthy infant.  Backache, varicose veins and hemorrhoids may develop or get worse.  You will tire more easily in the third trimester, which is normal.  The baby's movements may be stronger and more often.  You may become short of breath easily.  Your belly button may stick out.  A yellow discharge may leak from your breasts called colostrum.  You may have a bloody mucus discharge. This usually occurs a few days to a week before labor begins. HOME CARE INSTRUCTIONS   Keep your caregiver's appointments. Follow your caregiver's instructions regarding medication use, exercise, and diet.  During pregnancy, you are providing food for you and your baby. Continue to eat regular, well-balanced meals. Choose foods such as meat, fish, milk and other low fat dairy products, vegetables, fruits, and whole-grain breads and cereals. Your caregiver will tell you of the ideal weight gain.  A physical sexual relationship may be continued throughout pregnancy if there are no other problems such as early (premature) leaking of amniotic fluid from the membranes, vaginal bleeding, or belly (abdominal) pain.  Exercise regularly if there are no restrictions. Check with your caregiver if you are unsure of the safety of your exercises. Greater weight gain will occur in the last 2 trimesters of pregnancy. Exercising helps:  Control your weight.  Get you in shape for labor and delivery.  You lose weight after you deliver.  Rest a lot with legs elevated, or as needed for leg cramps or low back pain.  Wear a good support or jogging bra for breast tenderness during pregnancy. This may help if worn  during sleep. Pads or tissues may be used in the bra if you are leaking colostrum.  Do not use hot tubs, steam rooms, or saunas.  Wear your seat belt when driving. This protects you and your baby if you are in an accident.  Avoid raw meat, cat litter boxes and soil used by cats. These carry germs that can cause birth defects in the baby.  It is easier to loose urine during pregnancy. Tightening up and strengthening the pelvic muscles will help with this problem. You can practice stopping your urination while you are going to the bathroom. These are the same muscles you need to strengthen. It is also the muscles you would use if you were trying to stop from passing gas. You can practice tightening these muscles up 10 times a set and repeating this about 3 times per day. Once you know what muscles to tighten up, do not perform these exercises during urination. It is more likely to cause an infection by backing up the urine.  Ask for help if you have financial, counseling or nutritional needs during pregnancy. Your caregiver will be able to offer counseling for these   needs as well as refer you for other special needs.  Make a list of emergency phone numbers and have them available.  Plan on getting help from family or friends when you go home from the hospital.  Make a trial run to the hospital.  Take prenatal classes with the father to understand, practice and ask questions about the labor and delivery.  Prepare the baby's room/nursery.  Do not travel out of the city unless it is absolutely necessary and with the advice of your caregiver.  Wear only low or no heal shoes to have better balance and prevent falling. MEDICATIONS AND DRUG USE IN PREGNANCY  Take prenatal vitamins as directed. The vitamin should contain 1 milligram of folic acid. Keep all vitamins out of reach of children. Only a couple vitamins or tablets containing iron may be fatal to a baby or young child when  ingested.  Avoid use of all medications, including herbs, over-the-counter medications, not prescribed or suggested by your caregiver. Only take over-the-counter or prescription medicines for pain, discomfort, or fever as directed by your caregiver. Do not use aspirin, ibuprofen (Motrin, Advil, Nuprin) or naproxen (Aleve) unless OK'd by your caregiver.  Let your caregiver also know about herbs you may be using.  Alcohol is related to a number of birth defects. This includes fetal alcohol syndrome. All alcohol, in any form, should be avoided completely. Smoking will cause low birth rate and premature babies.  Street/illegal drugs are very harmful to the baby. They are absolutely forbidden. A baby born to an addicted mother will be addicted at birth. The baby will go through the same withdrawal an adult does. SEEK MEDICAL CARE IF: You have any concerns or worries during your pregnancy. It is better to call with your questions if you feel they cannot wait, rather than worry about them. DECISIONS ABOUT CIRCUMCISION You may or may not know the sex of your baby. If you know your baby is a boy, it may be time to think about circumcision. Circumcision is the removal of the foreskin of the penis. This is the skin that covers the sensitive end of the penis. There is no proven medical need for this. Often this decision is made on what is popular at the time or based upon religious beliefs and social issues. You can discuss these issues with your caregiver or pediatrician. SEEK IMMEDIATE MEDICAL CARE IF:   An unexplained oral temperature above 102 F (38.9 C) develops, or as your caregiver suggests.  You have leaking of fluid from the vagina (birth canal). If leaking membranes are suspected, take your temperature and tell your caregiver of this when you call.  There is vaginal spotting, bleeding or passing clots. Tell your caregiver of the amount and how many pads are used.  You develop a bad smelling  vaginal discharge with a change in the color from clear to white.  You develop vomiting that lasts more than 24 hours.  You develop chills or fever.  You develop shortness of breath.  You develop burning on urination.  You loose more than 2 pounds of weight or gain more than 2 pounds of weight or as suggested by your caregiver.  You notice sudden swelling of your face, hands, and feet or legs.  You develop belly (abdominal) pain. Round ligament discomfort is a common non-cancerous (benign) cause of abdominal pain in pregnancy. Your caregiver still must evaluate you.  You develop a severe headache that does not go away.  You develop visual   problems, blurred or double vision.  If you have not felt your baby move for more than 1 hour. If you think the baby is not moving as much as usual, eat something with sugar in it and lie down on your left side for an hour. The baby should move at least 4 to 5 times per hour. Call right away if your baby moves less than that.  You fall, are in a car accident or any kind of trauma.  There is mental or physical violence at home. Document Released: 06/05/2001 Document Revised: 09/03/2011 Document Reviewed: 12/08/2008 [employer] Patient Information 2013 [employer].  Breastfeeding Deciding to breastfeed is one of the best choices you can make for you and your baby. The information that follows gives a brief overview of the benefits of breastfeeding as well as common topics surrounding breastfeeding. BENEFITS OF BREASTFEEDING For the baby  The first milk (colostrum) helps the baby's digestive system function better.   There are antibodies in the mother's milk that help the baby fight off infections.   The baby has a lower incidence of asthma, allergies, and sudden infant death syndrome (SIDS).   The nutrients in breast milk are better for the baby than infant formulas, and breast milk helps the baby's brain grow better.   Babies who  breastfeed have less gas, colic, and constipation.  For the mother  Breastfeeding helps develop a very special bond between the mother and her baby.   Breastfeeding is convenient, always available at the correct temperature, and costs nothing.   Breastfeeding burns calories in the mother and helps her lose weight that was gained during pregnancy.   Breastfeeding makes the uterus contract back down to normal size faster and slows bleeding following delivery.   Breastfeeding mothers have a lower risk of developing breast cancer.  BREASTFEEDING FREQUENCY  A healthy, full-term baby may breastfeed as often as every hour or space his or her feedings to every 3 hours.   Watch your baby for signs of hunger. Nurse your baby if he or she shows signs of hunger. How often you nurse will vary from baby to baby.   Nurse as often as the baby requests, or when you feel the need to reduce the fullness of your breasts.   Awaken the baby if it has been 3 4 hours since the last feeding.   Frequent feeding will help the mother make more milk and will help prevent problems, such as sore nipples and engorgement of the breasts.  BABY'S POSITION AT THE BREAST  Whether lying down or sitting, be sure that the baby's tummy is facing your tummy.   Support the breast with 4 fingers underneath the breast and the thumb above. Make sure your fingers are well away from the nipple and baby's mouth.   Stroke the baby's lips gently with your finger or nipple.   When the baby's mouth is open wide enough, place all of your nipple and as much of the areola as possible into your baby's mouth.   Pull the baby in close so the tip of the nose and the baby's cheeks touch the breast during the feeding.  FEEDINGS AND SUCTION  The length of each feeding varies from baby to baby and from feeding to feeding.   The baby must suck about 2 3 minutes for your milk to get to him or her. This is called a "let down."  For this reason, allow the baby to feed on each breast as   long as he or she wants. Your baby will end the feeding when he or she has received the right balance of nutrients.   To break the suction, put your finger into the corner of the baby's mouth and slide it between his or her gums before removing your breast from his or her mouth. This will help prevent sore nipples.  HOW TO TELL WHETHER YOUR BABY IS GETTING ENOUGH BREAST MILK. Wondering whether or not your baby is getting enough milk is a common concern among mothers. You can be assured that your baby is getting enough milk if:   Your baby is actively sucking and you hear swallowing.   Your baby seems relaxed and satisfied after a feeding.   Your baby nurses at least 8 12 times in a 24 hour time period. Nurse your baby until he or she unlatches or falls asleep at the first breast (at least 10 20 minutes), then offer the second side.   Your baby is wetting 5 6 disposable diapers (6 8 cloth diapers) in a 24 hour period by 5 6 days of age.   Your baby is having at least 3 4 stools every 24 hours for the first 6 weeks. The stool should be soft and yellow.   Your baby should gain 4 7 ounces per week after he or she is 4 days old.   Your breasts feel softer after nursing.  REDUCING BREAST ENGORGEMENT  In the first week after your baby is born, you may experience signs of breast engorgement. When breasts are engorged, they feel heavy, warm, full, and may be tender to the touch. You can reduce engorgement if you:   Nurse frequently, every 2 3 hours. Mothers who breastfeed early and often have fewer problems with engorgement.   Place light ice packs on your breasts for 10 20 minutes between feedings. This reduces swelling. Wrap the ice packs in a lightweight towel to protect your skin. Bags of frozen vegetables work well for this purpose.   Take a warm shower or apply warm, moist heat to your breast for 5 10 minutes just before  each feeding. This increases circulation and helps the milk flow.   Gently massage your breast before and during the feeding. Using your finger tips, massage from the chest wall towards your nipple in a circular motion.   Make sure that the baby empties at least one breast at every feeding before switching sides.   Use a breast pump to empty the breasts if your baby is sleepy or not nursing well. You may also want to pump if you are returning to work oryou feel you are getting engorged.   Avoid bottle feeds, pacifiers, or supplemental feedings of water or juice in place of breastfeeding. Breast milk is all the food your baby needs. It is not necessary for your baby to have water or formula. In fact, to help your breasts make more milk, it is best not to give your baby supplemental feedings during the early weeks.   Be sure the baby is latched on and positioned properly while breastfeeding.   Wear a supportive bra, avoiding underwire styles.   Eat a balanced diet with enough fluids.   Rest often, relax, and take your prenatal vitamins to prevent fatigue, stress, and anemia.  If you follow these suggestions, your engorgement should improve in 24 48 hours. If you are still experiencing difficulty, call your lactation consultant or caregiver.  CARING FOR YOURSELF Take care of your   breasts  Bathe or shower daily.   Avoid using soap on your nipples.   Start feedings on your left breast at one feeding and on your right breast at the next feeding.   You will notice an increase in your milk supply 2 5 days after delivery. You may feel some discomfort from engorgement, which makes your breasts very firm and often tender. Engorgement "peaks" out within 24 48 hours. In the meantime, apply warm moist towels to your breasts for 5 10 minutes before feeding. Gentle massage and expression of some milk before feeding will soften your breasts, making it easier for your baby to latch on.    Wear a well-fitting nursing bra, and air dry your nipples for a 3 4minutes after each feeding.   Only use cotton bra pads.   Only use pure lanolin on your nipples after nursing. You do not need to wash it off before feeding the baby again. Another option is to express a few drops of breast milk and gently massage it into your nipples.  Take care of yourself  Eat well-balanced meals and nutritious snacks.   Drinking milk, fruit juice, and water to satisfy your thirst (about 8 glasses a day).   Get plenty of rest.  Avoid foods that you notice affect the baby in a bad way.  SEEK MEDICAL CARE IF:   You have difficulty with breastfeeding and need help.   You have a hard, red, sore area on your breast that is accompanied by a fever.   Your baby is too sleepy to eat well or is having trouble sleeping.   Your baby is wetting less than 6 diapers a day, by 5 days of age.   Your baby's skin or white part of his or her eyes is more yellow than it was in the hospital.   You feel depressed.  Document Released: 06/11/2005 Document Revised: 12/11/2011 Document Reviewed: 09/09/2011 [employer] Patient Information 2013 [employer].  

## 2012-11-21 ENCOUNTER — Encounter: Payer: Self-pay | Admitting: Obstetrics & Gynecology

## 2012-11-21 ENCOUNTER — Ambulatory Visit (INDEPENDENT_AMBULATORY_CARE_PROVIDER_SITE_OTHER): Payer: BC Managed Care – PPO | Admitting: Obstetrics & Gynecology

## 2012-11-21 VITALS — BP 102/76 | Wt 171.0 lb

## 2012-11-21 DIAGNOSIS — Z3480 Encounter for supervision of other normal pregnancy, unspecified trimester: Secondary | ICD-10-CM

## 2012-11-21 DIAGNOSIS — Z3493 Encounter for supervision of normal pregnancy, unspecified, third trimester: Secondary | ICD-10-CM

## 2012-11-21 MED ORDER — ALPRAZOLAM 0.5 MG PO TABS: 0.5000 mg | ORAL_TABLET | Freq: Every day | ORAL | Status: DC

## 2012-11-21 NOTE — Progress Notes (Signed)
Routine visit. Good FM. No OB problems. We have had a long discussion of her delivery options when it is taken into account that she wants a PPS and will need an Oopherectomy. She is leaning towards a scheduled PLTCS, PPS and oopherectomy at 39 weeks. I told her that I would be happy to assist her in this goal. She wants a refill of xanax (which she uses for public speaking). I have given her #40 with no refills. Cervical cultures at next visit.

## 2012-11-21 NOTE — Progress Notes (Signed)
P - 113 - Pt needs Xanax refilled

## 2012-12-05 ENCOUNTER — Encounter: Payer: BC Managed Care – PPO | Admitting: Obstetrics & Gynecology

## 2012-12-08 ENCOUNTER — Ambulatory Visit (INDEPENDENT_AMBULATORY_CARE_PROVIDER_SITE_OTHER): Payer: BC Managed Care – PPO | Admitting: Obstetrics and Gynecology

## 2012-12-08 ENCOUNTER — Encounter: Payer: Self-pay | Admitting: Obstetrics and Gynecology

## 2012-12-08 VITALS — BP 113/85 | Wt 174.0 lb

## 2012-12-08 DIAGNOSIS — Z3480 Encounter for supervision of other normal pregnancy, unspecified trimester: Secondary | ICD-10-CM

## 2012-12-08 DIAGNOSIS — O09529 Supervision of elderly multigravida, unspecified trimester: Secondary | ICD-10-CM

## 2012-12-08 DIAGNOSIS — O09523 Supervision of elderly multigravida, third trimester: Secondary | ICD-10-CM

## 2012-12-08 DIAGNOSIS — N9489 Other specified conditions associated with female genital organs and menstrual cycle: Secondary | ICD-10-CM

## 2012-12-08 DIAGNOSIS — Z3493 Encounter for supervision of normal pregnancy, unspecified, third trimester: Secondary | ICD-10-CM

## 2012-12-08 LAB — OB RESULTS CONSOLE GBS: GBS: POSITIVE

## 2012-12-08 NOTE — Progress Notes (Signed)
Patient doing well without complaints. Patient opted to have a vaginal delivery and not a scheduled c-section. She would then like to have her BTL and oophorectomy performed in December and hoping that it can all be done laparoscopically. Patient was informed that we may need to repeat ultrasound to ensure that cyst has not grown any further. If she has a cesarean section than she would like everything addressed on that day.  Cultures collected FM/PTL precautions reviewed

## 2012-12-08 NOTE — Progress Notes (Signed)
P - 124 - Pt states approx 1 week ago had thick, clumpy discharge - had more pain/pressure and discomfort in the vaginal area.

## 2012-12-09 LAB — GC/CHLAMYDIA PROBE AMP
CT Probe RNA: NEGATIVE
GC Probe RNA: NEGATIVE

## 2012-12-09 LAB — OB RESULTS CONSOLE GC/CHLAMYDIA: Gonorrhea: NEGATIVE

## 2012-12-13 LAB — CULTURE, BETA STREP (GROUP B ONLY)

## 2012-12-15 ENCOUNTER — Encounter: Payer: Self-pay | Admitting: Obstetrics and Gynecology

## 2012-12-17 ENCOUNTER — Ambulatory Visit (INDEPENDENT_AMBULATORY_CARE_PROVIDER_SITE_OTHER): Payer: BC Managed Care – PPO | Admitting: Family Medicine

## 2012-12-17 VITALS — BP 105/79 | Wt 175.0 lb

## 2012-12-17 DIAGNOSIS — Z3493 Encounter for supervision of normal pregnancy, unspecified, third trimester: Secondary | ICD-10-CM

## 2012-12-17 DIAGNOSIS — Z3480 Encounter for supervision of other normal pregnancy, unspecified trimester: Secondary | ICD-10-CM

## 2012-12-17 NOTE — Progress Notes (Signed)
Doing well having irregular contractions. Labor precautions reviewed.

## 2012-12-17 NOTE — Patient Instructions (Addendum)
Vaginal Delivery Your caregiver must first be sure you are in labor. Signs of labor include:  You may pass what is called "the mucus plug" before labor begins. This is a small amount of blood stained mucus.  Regular uterine contractions.  The time between contractions get closer together.  The discomfort and pain gradually gets more intense.  Pains are mostly located in the back.  Pains get worse when walking.  The cervix (the opening of the uterus) becomes thinner (begins to efface) and opens up (dilates). Once you are in labor and admitted into the hospital or care center, your caregiver will do the following:  A complete physical examination.  Check your vital signs (blood pressure, pulse, temperature and the fetal heart rate).  Do a vaginal examination (using a sterile glove and lubricant) to determine:  The position (presentation) of the baby (head [vertex] or buttock first).  The level (station) of the baby's head in the birth canal.  The effacement and dilatation of the cervix.  You may have your pubic hair shaved and be given an enema depending on your caregiver and the circumstance.  An electronic monitor is usually placed on your abdomen. The monitor follows the length and intensity of the contractions, as well as the baby's heart rate.  Usually, your caregiver will insert an IV in your arm with a bottle of sugar water. This is done as a precaution so that medications can be given to you quickly during labor or delivery. NORMAL LABOR AND DELIVERY IS DIVIDED UP INTO 3 STAGES: First Stage This is when regular contractions begin and the cervix begins to efface and dilate. This stage can last from 3 to 15 hours. The end of the first stage is when the cervix is 100% effaced and 10 centimeters dilated. Pain medications may be given by   Injection (morphine, demerol, etc.)  Regional anesthesia (spinal, caudal or epidural, anesthetics given in different locations of the  spine). Paracervical pain medication may be given, which is an injection of and anesthetic on each side of the cervix. A pregnant woman may request to have "Natural Childbirth" which is not to have any medications or anesthesia during her labor and delivery. Second Stage This is when the baby comes down through the birth canal (vagina) and is born. This can take 1 to 4 hours. As the baby's head comes down through the birth canal, you may feel like you are going to have a bowel movement. You will get the urge to bear down and push until the baby is delivered. As the baby's head is being delivered, the caregiver will decide if an episiotomy (a cut in the perineum and vagina area) is needed to prevent tearing of the tissue in this area. The episiotomy is sewn up after the delivery of the baby and placenta. Sometimes a mask with nitrous oxide is given for the mother to breath during the delivery of the baby to help if there is too much pain. The end of Stage 2 is when the baby is fully delivered. Then when the umbilical cord stops pulsating it is clamped and cut. Third Stage The third stage begins after the baby is completely delivered and ends after the placenta (afterbirth) is delivered. This usually takes 5 to 30 minutes. After the placenta is delivered, a medication is given either by intravenous or injection to help contract the uterus and prevent bleeding. The third stage is not painful and pain medication is usually not necessary. If an   episiotomy was done, it is repaired at this time. After the delivery, the mother is watched and monitored closely for 1 to 2 hours to make sure there is no postpartum bleeding (hemorrhage). If there is a lot of bleeding, medication is given to contract the uterus and stop the bleeding. Document Released: 03/20/2008 Document Revised: 03/05/2012 Document Reviewed: 03/20/2008 [employer] Patient Information 2014 [employer].  Pregnancy - Third Trimester The third  trimester of pregnancy (the last 3 months) is a period of the most rapid growth for you and your baby. The baby approaches a length of 20 inches and a weight of 6 to 10 pounds. The baby is adding on fat and getting ready for life outside your body. While inside, babies have periods of sleeping and waking, sucking thumbs, and hiccuping. You can often feel small contractions of the uterus. This is false labor. It is also called Braxton-Hicks contractions. This is like a practice for labor. The usual problems in this stage of pregnancy include more difficulty breathing, swelling of the hands and feet from water retention, and having to urinate more often because of the uterus and baby pressing on your bladder.  PRENATAL EXAMS  Blood work may continue to be done during prenatal exams. These tests are done to check on your health and the probable health of your baby. Blood work is used to follow your blood levels (hemoglobin). Anemia (low hemoglobin) is common during pregnancy. Iron and vitamins are given to help prevent this. You may also continue to be checked for diabetes. Some of the past blood tests may be done again.  The size of the uterus is measured during each visit. This makes sure your baby is growing properly according to your pregnancy dates.  Your blood pressure is checked every prenatal visit. This is to make sure you are not getting toxemia.  Your urine is checked every prenatal visit for infection, diabetes, and protein.  Your weight is checked at each visit. This is done to make sure gains are happening at the suggested rate and that you and your baby are growing normally.  Sometimes, an ultrasound is performed to confirm the position and the proper growth and development of the baby. This is a test done that bounces harmless sound waves off the baby so your caregiver can more accurately determine a due date.  Discuss the type of pain medicine and anesthesia you will have during your  labor and delivery.  Discuss the possibility and anesthesia if a cesarean section might be necessary.  Inform your caregiver if there is any mental or physical violence at home. Sometimes, a specialized non-stress test, contraction stress test, and biophysical profile are done to make sure the baby is not having a problem. Checking the amniotic fluid surrounding the baby is called an amniocentesis. The amniotic fluid is removed by sticking a needle into the belly (abdomen). This is sometimes done near the end of pregnancy if an early delivery is required. In this case, it is done to help make sure the baby's lungs are mature enough for the baby to live outside of the womb. If the lungs are not mature and it is unsafe to deliver the baby, an injection of cortisone medicine is given to the mother 1 to 2 days before the delivery. This helps the baby's lungs mature and makes it safer to deliver the baby. CHANGES OCCURING IN THE THIRD TRIMESTER OF PREGNANCY Your body goes through many changes during pregnancy. They vary from person   to person. Talk to your caregiver about changes you notice and are concerned about.  During the last trimester, you have probably had an increase in your appetite. It is normal to have cravings for certain foods. This varies from person to person and pregnancy to pregnancy.  You may begin to get stretch marks on your hips, abdomen, and breasts. These are normal changes in the body during pregnancy. There are no exercises or medicines to take which prevent this change.  Constipation may be treated with a stool softener or adding bulk to your diet. Drinking lots of fluids, fiber in vegetables, fruits, and whole grains are helpful.  Exercising is also helpful. If you have been very active up until your pregnancy, most of these activities can be continued during your pregnancy. If you have been less active, it is helpful to start an exercise program such as walking. Consult your  caregiver before starting exercise programs.  Avoid all smoking, alcohol, non-prescribed drugs, herbs and "street drugs" during your pregnancy. These chemicals affect the formation and growth of the baby. Avoid chemicals throughout the pregnancy to ensure the delivery of a healthy infant.  Backache, varicose veins, and hemorrhoids may develop or get worse.  You will tire more easily in the third trimester, which is normal.  The baby's movements may be stronger and more often.  You may become short of breath easily.  Your belly button may stick out.  A yellow discharge may leak from your breasts called colostrum.  You may have a bloody mucus discharge. This usually occurs a few days to a week before labor begins. HOME CARE INSTRUCTIONS   Keep your caregiver's appointments. Follow your caregiver's instructions regarding medicine use, exercise, and diet.  During pregnancy, you are providing food for you and your baby. Continue to eat regular, well-balanced meals. Choose foods such as meat, fish, milk and other low fat dairy products, vegetables, fruits, and whole-grain breads and cereals. Your caregiver will tell you of the ideal weight gain.  A physical sexual relationship may be continued throughout pregnancy if there are no other problems such as early (premature) leaking of amniotic fluid from the membranes, vaginal bleeding, or belly (abdominal) pain.  Exercise regularly if there are no restrictions. Check with your caregiver if you are unsure of the safety of your exercises. Greater weight gain will occur in the last 2 trimesters of pregnancy. Exercising helps:  Control your weight.  Get you in shape for labor and delivery.  You lose weight after you deliver.  Rest a lot with legs elevated, or as needed for leg cramps or low back pain.  Wear a good support or jogging bra for breast tenderness during pregnancy. This may help if worn during sleep. Pads or tissues may be used in  the bra if you are leaking colostrum.  Do not use hot tubs, steam rooms, or saunas.  Wear your seat belt when driving. This protects you and your baby if you are in an accident.  Avoid raw meat, cat litter boxes and soil used by cats. These carry germs that can cause birth defects in the baby.  It is easier to leak urine during pregnancy. Tightening up and strengthening the pelvic muscles will help with this problem. You can practice stopping your urination while you are going to the bathroom. These are the same muscles you need to strengthen. It is also the muscles you would use if you were trying to stop from passing gas. You can practice   tightening these muscles up 10 times a set and repeating this about 3 times per day. Once you know what muscles to tighten up, do not perform these exercises during urination. It is more likely to cause an infection by backing up the urine.  Ask for help if you have financial, counseling, or nutritional needs during pregnancy. Your caregiver will be able to offer counseling for these needs as well as refer you for other special needs.  Make a list of emergency phone numbers and have them available.  Plan on getting help from family or friends when you go home from the hospital.  Make a trial run to the hospital.  Take prenatal classes with the father to understand, practice, and ask questions about the labor and delivery.  Prepare the baby's room or nursery.  Do not travel out of the city unless it is absolutely necessary and with the advice of your caregiver.  Wear only low or no heal shoes to have better balance and prevent falling. MEDICINES AND DRUG USE IN PREGNANCY  Take prenatal vitamins as directed. The vitamin should contain 1 milligram of folic acid. Keep all vitamins out of reach of children. Only a couple vitamins or tablets containing iron may be fatal to a baby or young child when ingested.  Avoid use of all medicines, including herbs,  over-the-counter medicines, not prescribed or suggested by your caregiver. Only take over-the-counter or prescription medicines for pain, discomfort, or fever as directed by your caregiver. Do not use aspirin, ibuprofen or naproxen unless approved by your caregiver.  Let your caregiver also know about herbs you may be using.  Alcohol is related to a number of birth defects. This includes fetal alcohol syndrome. All alcohol, in any form, should be avoided completely. Smoking will cause low birth rate and premature babies.  Illegal drugs are very harmful to the baby. They are absolutely forbidden. A baby born to an addicted mother will be addicted at birth. The baby will go through the same withdrawal an adult does. SEEK MEDICAL CARE IF: You have any concerns or worries during your pregnancy. It is better to call with your questions if you feel they cannot wait, rather than worry about them. SEEK IMMEDIATE MEDICAL CARE IF:   An unexplained oral temperature above 102 F (38.9 C) develops, or as your caregiver suggests.  You have leaking of fluid from the vagina. If leaking membranes are suspected, take your temperature and tell your caregiver of this when you call.  There is vaginal spotting, bleeding or passing clots. Tell your caregiver of the amount and how many pads are used.  You develop a bad smelling vaginal discharge with a change in the color from clear to white.  You develop vomiting that lasts more than 24 hours.  You develop chills or fever.  You develop shortness of breath.  You develop burning on urination.  You loose more than 2 pounds of weight or gain more than 2 pounds of weight or as suggested by your caregiver.  You notice sudden swelling of your face, hands, and feet or legs.  You develop belly (abdominal) pain. Round ligament discomfort is a common non-cancerous (benign) cause of abdominal pain in pregnancy. Your caregiver still must evaluate you.  You develop a  severe headache that does not go away.  You develop visual problems, blurred or double vision.  If you have not felt your baby move for more than 1 hour. If you think the baby is not   moving as much as usual, eat something with sugar in it and lie down on your left side for an hour. The baby should move at least 4 to 5 times per hour. Call right away if your baby moves less than that.  You fall, are in a car accident, or any kind of trauma.  There is mental or physical violence at home. Document Released: 06/05/2001 Document Revised: 03/05/2012 Document Reviewed: 12/08/2008 [employer] Patient Information 2014 [employer].  Breastfeeding A change in hormones during your pregnancy causes growth of your breast tissue and an increase in number and size of milk ducts. The hormone prolactin allows proteins, sugars, and fats from your blood supply to make breast milk in your milk-producing glands. The hormone progesterone prevents breast milk from being released before the birth of your baby. After the birth of your baby, your progesterone level decreases allowing breast milk to be released. Thoughts of your baby, as well as his or her sucking or crying, can stimulate the release of milk from the milk-producing glands. Deciding to breastfeed (nurse) is one of the best choices you can make for you and your baby. The information that follows gives a brief review of the benefits, as well as other important skills to know about breastfeeding. BENEFITS OF BREASTFEEDING For your baby  The first milk (colostrum) helps your baby's digestive system function better.   There are antibodies in your milk that help your baby fight off infections.   Your baby has a lower incidence of asthma, allergies, and sudden infant death syndrome (SIDS).   The nutrients in breast milk are better for your baby than infant formulas.  Breast milk improves your baby's brain development.   Your baby will have less gas,  colic, and constipation.  Your baby is less likely to develop other conditions, such as childhood obesity, asthma, or diabetes mellitus. For you  Breastfeeding helps develop a very special bond between you and your baby.   Breastfeeding is convenient, always available at the correct temperature, and costs nothing.   Breastfeeding helps to burn calories and helps you lose the weight gained during pregnancy.   Breastfeeding makes your uterus contract back down to normal size faster and slows bleeding following delivery.   Breastfeeding mothers have a lower risk of developing osteoporosis or breast or ovarian cancer later in life.  BREASTFEEDING FREQUENCY  A healthy, full-term baby may breastfeed as often as every hour or space his or her feedings to every 3 hours. Breastfeeding frequency will vary from baby to baby.   Newborns should be fed no less than every 2 3 hours during the day and every 4 5 hours during the night. You should breastfeed a minimum of 8 feedings in a 24 hour period.  Awaken your baby to breastfeed if it has been 3 4 hours since the last feeding.  Breastfeed when you feel the need to reduce the fullness of your breasts or when your newborn shows signs of hunger. Signs that your baby may be hungry include:  Increased alertness or activity.  Stretching.  Movement of the head from side to side.  Movement of the head and opening of the mouth when the corner of the mouth or cheek is stroked (rooting).  Increased sucking sounds, smacking lips, cooing, sighing, or squeaking.  Hand-to-mouth movements.  Increased sucking of fingers or hands.  Fussing.  Intermittent crying.  Signs of extreme hunger will require calming and consoling before you try to feed your baby. Signs   of extreme hunger may include:  Restlessness.  A loud, strong cry.  Screaming.  Frequent feeding will help you make more milk and will help prevent problems, such as sore nipples and  engorgement of the breasts.  BREASTFEEDING   Whether lying down or sitting, be sure that the baby's abdomen is facing your abdomen.   Support your breast with 4 fingers under your breast and your thumb above your nipple. Make sure your fingers are well away from your nipple and your baby's mouth.   Stroke your baby's lips gently with your finger or nipple.   When your baby's mouth is open wide enough, place all of your nipple and as much of the colored area around your nipple (areola) as possible into your baby's mouth.  More areola should be visible above his or her upper lip than below his or her lower lip.  Your baby's tongue should be between his or her lower gum and your breast.  Ensure that your baby's mouth is correctly positioned around the nipple (latched). Your baby's lips should create a seal on your breast.  Signs that your baby has effectively latched onto your nipple include:  Tugging or sucking without pain.  Swallowing heard between sucks.  Absent click or smacking sound.  Muscle movement above and in front of his or her ears with sucking.  Your baby must suck about 2 3 minutes in order to get your milk. Allow your baby to feed on each breast as long as he or she wants. Nurse your baby until he or she unlatches or falls asleep at the first breast, then offer the second breast.  Signs that your baby is full and satisfied include:  A gradual decrease in the number of sucks or complete cessation of sucking.  Falling asleep.  Extension or relaxation of his or her body.  Retention of a small amount of milk in his or her mouth.  Letting go of your breast by himself or herself.  Signs of effective breastfeeding in you include:  Breasts that have increased firmness, weight, and size prior to feeding.  Breasts that are softer after nursing.  Increased milk volume, as well as a change in milk consistency and color by the 5th day of breastfeeding.  Breast  fullness relieved by breastfeeding.  Nipples are not sore, cracked, or bleeding.  If needed, break the suction by putting your finger into the corner of your baby's mouth and sliding your finger between his or her gums. Then, remove your breast from his or her mouth.  It is common for babies to spit up a small amount after a feeding.  Babies often swallow air during feeding. This can make babies fussy. Burping your baby between breasts can help with this.  Vitamin D supplements are recommended for babies who get only breast milk.  Avoid using a pacifier during your baby's first 4 6 weeks.  Avoid supplemental feedings of water, formula, or juice in place of breastfeeding. Breast milk is all the food your baby needs. It is not necessary for your baby to have water or formula. Your breasts will make more milk if supplemental feedings are avoided during the early weeks. HOW TO TELL WHETHER YOUR BABY IS GETTING ENOUGH BREAST MILK Wondering whether or not your baby is getting enough milk is a common concern among mothers. You can be assured that your baby is getting enough milk if:   Your baby is actively sucking and you hear swallowing.     Your baby seems relaxed and satisfied after a feeding.   Your baby nurses at least 8 12 times in a 24 hour time period.  During the first 3 5 days of age:  Your baby is wetting at least 3 5 diapers in a 24 hour period. The urine should be clear and pale yellow.  Your baby is having at least 3 4 stools in a 24 hour period. The stool should be soft and yellow.  At 5 7 days of age, your baby is having at least 3 6 stools in a 24 hour period. The stool should be seedy and yellow by 5 days of age.  Your baby has a weight loss less than 7 10% during the first 3 days of age.  Your baby does not lose weight after 3 7 days of age.  Your baby gains 4 7 ounces each week after he or she is 4 days of age.  Your baby gains weight by 5 days of age and is back to  birth weight within 2 weeks. ENGORGEMENT In the first week after your baby is born, you may experience extremely full breasts (engorgement). When engorged, your breasts may feel heavy, warm, or tender to the touch. Engorgement peaks within 24 48 hours after delivery of your baby.  Engorgement may be reduced by:  Continuing to breastfeed.  Increasing the frequency of breastfeeding.  Taking warm showers or applying warm, moist heat to your breasts just before each feeding. This increases circulation and helps the milk flow.   Gently massaging your breast before and during the feedings. With your fingertips, massage from your chest wall towards your nipple in a circular motion.   Ensuring that your baby empties at least one breast at every feeding. It also helps to start the next feeding on the opposite breast.   Expressing breast milk by hand or by using a breast pump to empty the breasts if your baby is sleepy, or not nursing well. You may also want to express milk if you are returning to work oryou feel you are getting engorged.  Ensuring your baby is latched on and positioned properly while breastfeeding. If you follow these suggestions, your engorgement should improve in 24 48 hours. If you are still experiencing difficulty, call your lactation consultant or caregiver.  CARING FOR YOURSELF Take care of your breasts.  Bathe or shower daily.   Avoid using soap on your nipples.   Wear a supportive bra. Avoid wearing underwire style bras.  Air dry your nipples for a 3 4minutes after each feeding.   Use only cotton bra pads to absorb breast milk leakage. Leaking of breast milk between feedings is normal.   Use only pure lanolin on your nipples after nursing. You do not need to wash it off before feeding your baby again. Another option is to express a few drops of breast milk and gently massage that milk into your nipples.  Continue breast self-awareness checks. Take care of  yourself.  Eat healthy foods. Alternate 3 meals with 3 snacks.  Avoid foods that you notice affect your baby in a bad way.  Drink milk, fruit juice, and water to satisfy your thirst (about 8 glasses a day).   Rest often, relax, and take your prenatal vitamins to prevent fatigue, stress, and anemia.  Avoid chewing and smoking tobacco.  Avoid alcohol and drug use.  Take over-the-counter and prescribed medicine only as directed by your caregiver or pharmacist. You should always check with   your caregiver or pharmacist before taking any new medicine, vitamin, or herbal supplement.  Know that pregnancy is possible while breastfeeding. If desired, talk to your caregiver about family planning and safe birth control methods that may be used while breastfeeding. SEEK MEDICAL CARE IF:   You feel like you want to stop breastfeeding or have become frustrated with breastfeeding.  You have painful breasts or nipples.  Your nipples are cracked or bleeding.  Your breasts are red, tender, or warm.  You have a swollen area on either breast.  You have a fever or chills.  You have nausea or vomiting.  You have drainage from your nipples.  Your breasts do not become full before feedings by the 5th day after delivery.  You feel sad and depressed.  Your baby is too sleepy to eat well.  Your baby is having trouble sleeping.   Your baby is wetting less than 3 diapers in a 24 hour period.  Your baby has less than 3 stools in a 24 hour period.  Your baby's skin or the white part of his or her eyes becomes more yellow.   Your baby is not gaining weight by 5 days of age. MAKE SURE YOU:   Understand these instructions.  Will watch your condition.  Will get help right away if you are not doing well or get worse. Document Released: 06/11/2005 Document Revised: 03/05/2012 Document Reviewed: 01/16/2012 [employer] Patient Information 2014 [employer].  

## 2012-12-17 NOTE — Progress Notes (Signed)
P = 119 

## 2012-12-23 ENCOUNTER — Ambulatory Visit (INDEPENDENT_AMBULATORY_CARE_PROVIDER_SITE_OTHER): Payer: BC Managed Care – PPO | Admitting: Obstetrics and Gynecology

## 2012-12-23 ENCOUNTER — Encounter: Payer: Self-pay | Admitting: Obstetrics and Gynecology

## 2012-12-23 VITALS — BP 106/83 | Wt 175.0 lb

## 2012-12-23 DIAGNOSIS — O09523 Supervision of elderly multigravida, third trimester: Secondary | ICD-10-CM

## 2012-12-23 DIAGNOSIS — Z3480 Encounter for supervision of other normal pregnancy, unspecified trimester: Secondary | ICD-10-CM

## 2012-12-23 DIAGNOSIS — O09529 Supervision of elderly multigravida, unspecified trimester: Secondary | ICD-10-CM

## 2012-12-23 DIAGNOSIS — Z3493 Encounter for supervision of normal pregnancy, unspecified, third trimester: Secondary | ICD-10-CM

## 2012-12-23 DIAGNOSIS — N9489 Other specified conditions associated with female genital organs and menstrual cycle: Secondary | ICD-10-CM

## 2012-12-23 NOTE — Progress Notes (Signed)
P=111 

## 2012-12-23 NOTE — Progress Notes (Signed)
Patient doing well without complaints. FM/labor precautions reviewed 

## 2012-12-30 ENCOUNTER — Ambulatory Visit (INDEPENDENT_AMBULATORY_CARE_PROVIDER_SITE_OTHER): Payer: BC Managed Care – PPO | Admitting: Obstetrics & Gynecology

## 2012-12-30 VITALS — BP 118/86 | Wt 176.0 lb

## 2012-12-30 DIAGNOSIS — O09529 Supervision of elderly multigravida, unspecified trimester: Secondary | ICD-10-CM

## 2012-12-30 DIAGNOSIS — O09523 Supervision of elderly multigravida, third trimester: Secondary | ICD-10-CM

## 2012-12-30 DIAGNOSIS — Z3493 Encounter for supervision of normal pregnancy, unspecified, third trimester: Secondary | ICD-10-CM

## 2012-12-30 DIAGNOSIS — Z3480 Encounter for supervision of other normal pregnancy, unspecified trimester: Secondary | ICD-10-CM

## 2012-12-30 NOTE — Patient Instructions (Signed)
Return to clinic for any obstetric concerns or go to [facility] for evaluation  

## 2012-12-30 NOTE — Progress Notes (Signed)
Reports losing mucus plug, no regular contractions, no bleeding, no LOF.  Good fetal movement.   No cervical change from previous exam. No other complaints or concerns.  Fetal movement and labor precautions reviewed.  Start postdates testing next week.

## 2012-12-30 NOTE — Progress Notes (Signed)
P-117 

## 2012-12-31 ENCOUNTER — Encounter: Payer: BC Managed Care – PPO | Admitting: Family Medicine

## 2013-01-02 ENCOUNTER — Ambulatory Visit (INDEPENDENT_AMBULATORY_CARE_PROVIDER_SITE_OTHER): Payer: BC Managed Care – PPO | Admitting: Family Medicine

## 2013-01-02 DIAGNOSIS — Z3480 Encounter for supervision of other normal pregnancy, unspecified trimester: Secondary | ICD-10-CM

## 2013-01-02 DIAGNOSIS — O09529 Supervision of elderly multigravida, unspecified trimester: Secondary | ICD-10-CM

## 2013-01-02 DIAGNOSIS — Z3493 Encounter for supervision of normal pregnancy, unspecified, third trimester: Secondary | ICD-10-CM

## 2013-01-02 NOTE — Progress Notes (Signed)
Patient ID: Whitney Contreras, female   DOB: 1976-04-13, 37 y.o.   MRN: [id number] NST reviewed and reactive.

## 2013-01-06 ENCOUNTER — Ambulatory Visit (HOSPITAL_COMMUNITY)
Admission: RE | Admit: 2013-01-06 | Discharge: 2013-01-06 | Disposition: A | Discharge status: Home / Self Care | Payer: BC Managed Care – PPO | Source: Ambulatory Visit | Attending: Obstetrics & Gynecology | Admitting: Obstetrics & Gynecology

## 2013-01-06 DIAGNOSIS — Z3493 Encounter for supervision of normal pregnancy, unspecified, third trimester: Secondary | ICD-10-CM

## 2013-01-06 DIAGNOSIS — O09523 Supervision of elderly multigravida, third trimester: Secondary | ICD-10-CM

## 2013-01-06 DIAGNOSIS — Z3689 Encounter for other specified antenatal screening: Secondary | ICD-10-CM | POA: Insufficient documentation

## 2013-01-06 DIAGNOSIS — O480 Post-term pregnancy: Secondary | ICD-10-CM | POA: Insufficient documentation

## 2013-01-07 ENCOUNTER — Encounter: Payer: Self-pay | Admitting: Obstetrics & Gynecology

## 2013-01-08 ENCOUNTER — Encounter: Payer: Self-pay | Admitting: Family Medicine

## 2013-01-08 ENCOUNTER — Ambulatory Visit (INDEPENDENT_AMBULATORY_CARE_PROVIDER_SITE_OTHER): Payer: BC Managed Care – PPO | Admitting: Family Medicine

## 2013-01-08 ENCOUNTER — Encounter: Payer: Self-pay | Admitting: Advanced Practice Midwife

## 2013-01-08 VITALS — BP 107/80 | Wt 177.0 lb

## 2013-01-08 DIAGNOSIS — O480 Post-term pregnancy: Secondary | ICD-10-CM

## 2013-01-08 DIAGNOSIS — Z3480 Encounter for supervision of other normal pregnancy, unspecified trimester: Secondary | ICD-10-CM

## 2013-01-08 NOTE — Progress Notes (Signed)
P-110 

## 2013-01-08 NOTE — Progress Notes (Signed)
NST reviewed and reactive. For IOL at 7:30 am

## 2013-01-08 NOTE — H&P (Signed)
Whitney Contreras is a 37 y.o. female [id number] with IUP at [redacted]w[redacted]d presenting for IOL for postdates. Prenatal care at [facility] since 6 wks  Prenatal History/Complications: Right ovarian mass AMA Past Medical History: Past Medical History  Diagnosis Date  . 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 HSV-1 infection     Valtrex PRN  . Endometriosis   . Miscarriage     x2  . History of abnormal cervical Pap smear   . Anxiety   . Anemia     during pregnancy  . Abnormal Pap smear     Past Surgical History: Past Surgical History  Procedure Laterality Date  . Foot surgery      buion remove  . Laparoscopy    . Wisdom tooth extraction      x4    Obstetrical History: OB History   Grav Para Term Preterm Abortions TAB SAB Ect Mult Living   4 1 1  0 2 0 2 0 0 1      Social History: History   Social History  . Marital Status: Married    Spouse Name: N/A    Number of Children: N/A  . Years of Education: N/A   Social History Main Topics  . Smoking status: Former Games developer  . Smokeless tobacco: Never Used  . Alcohol Use: No  . Drug Use: No  . Sexually Active: Yes -- Female partner(s)   Other Topics Concern  . Not on file   Social History Narrative  . No narrative on file    Family History: Family History  Problem Relation Age of Onset  . Adopted: Yes    Allergies: No Known Allergies   (Not in a hospital admission)   Review of Systems   Constitutional: Negative for fever and chills Eyes: Negative for visual disturbances Respiratory: Negative for shortness of breath, dyspnea Cardiovascular: Negative for chest pain or palpitations  Gastrointestinal: Negative for vomiting, diarrhea and constipation Genitourinary: Negative for dysuria and urgency Musculoskeletal: Negative for back pain, joint pain, myalgias  Neurological: Negative for dizziness and headaches    PHYSICAL EXAM:  Last menstrual period 03/28/2012. General appearance: alert,  cooperative and no distress Lungs: clear to auscultation bilaterally Heart: regular rate and rhythm Abdomen: soft, non-tender; bowel sounds normal Extremities: Homans sign is negative, no sign of DVT DTR's 2+ CX 1-2/50/-2 Presentation: cephalic Fetal monitoringBaseline: 140 bpm, Variability: Good {> 6 bpm), Accelerations: Reactive and Decelerations: Absent Uterine activityNone     Prenatal labs: ABO, Rh: B/POS/-- (11/18 1353) Antibody: NEG (11/18 1353) Rubella:  immune RPR: NON REAC (04/11 1127)  HBsAg: NEGATIVE (11/18 1353)  HIV: NON REACTIVE (04/11 1127)  GBS:   positive 1 hr Glucola 104 Genetic screening Harmony normal Anatomy US normal    Assessment: Whitney Contreras is a 37 y.o. [id number] with an IUP at [redacted]w[redacted]d presenting for IOL for postdates  Plan: Foley with low dose pitocin   CRESENZO-DISHMAN,Madalynne Gutmann 01/09/2013, 8:17 AM

## 2013-01-08 NOTE — Patient Instructions (Addendum)
Breastfeeding A change in hormones during your pregnancy causes growth of your breast tissue and an increase in number and size of milk ducts. The hormone prolactin allows proteins, sugars, and fats from your blood supply to make breast milk in your milk-producing glands. The hormone progesterone prevents breast milk from being released before the birth of your baby. After the birth of your baby, your progesterone level decreases allowing breast milk to be released. Thoughts of your baby, as well as his or her sucking or crying, can stimulate the release of milk from the milk-producing glands. Deciding to breastfeed (nurse) is one of the best choices you can make for you and your baby. The information that follows gives a brief review of the benefits, as well as other important skills to know about breastfeeding. BENEFITS OF BREASTFEEDING For your baby  The first milk (colostrum) helps your baby's digestive system function better.   There are antibodies in your milk that help your baby fight off infections.   Your baby has a lower incidence of asthma, allergies, and sudden infant death syndrome (SIDS).   The nutrients in breast milk are better for your baby than infant formulas.  Breast milk improves your baby's brain development.   Your baby will have less gas, colic, and constipation.  Your baby is less likely to develop other conditions, such as childhood obesity, asthma, or diabetes mellitus. For you  Breastfeeding helps develop a very special bond between you and your baby.   Breastfeeding is convenient, always available at the correct temperature, and costs nothing.   Breastfeeding helps to burn calories and helps you lose the weight gained during pregnancy.   Breastfeeding makes your uterus contract back down to normal size faster and slows bleeding following delivery.   Breastfeeding mothers have a lower risk of developing osteoporosis or breast or ovarian cancer later  in life.  BREASTFEEDING FREQUENCY  A healthy, full-term baby may breastfeed as often as every hour or space his or her feedings to every 3 hours. Breastfeeding frequency will vary from baby to baby.   Newborns should be fed no less than every 2 3 hours during the day and every 4 5 hours during the night. You should breastfeed a minimum of 8 feedings in a 24 hour period.  Awaken your baby to breastfeed if it has been 3 4 hours since the last feeding.  Breastfeed when you feel the need to reduce the fullness of your breasts or when your newborn shows signs of hunger. Signs that your baby may be hungry include:  Increased alertness or activity.  Stretching.  Movement of the head from side to side.  Movement of the head and opening of the mouth when the corner of the mouth or cheek is stroked (rooting).  Increased sucking sounds, smacking lips, cooing, sighing, or squeaking.  Hand-to-mouth movements.  Increased sucking of fingers or hands.  Fussing.  Intermittent crying.  Signs of extreme hunger will require calming and consoling before you try to feed your baby. Signs of extreme hunger may include:  Restlessness.  A loud, strong cry.  Screaming.  Frequent feeding will help you make more milk and will help prevent problems, such as sore nipples and engorgement of the breasts.  BREASTFEEDING   Whether lying down or sitting, be sure that the baby's abdomen is facing your abdomen.   Support your breast with 4 fingers under your breast and your thumb above your nipple. Make sure your fingers are well away from  your nipple and your baby's mouth.   Stroke your baby's lips gently with your finger or nipple.   When your baby's mouth is open wide enough, place all of your nipple and as much of the colored area around your nipple (areola) as possible into your baby's mouth.  More areola should be visible above his or her upper lip than below his or her lower lip.  Your  baby's tongue should be between his or her lower gum and your breast.  Ensure that your baby's mouth is correctly positioned around the nipple (latched). Your baby's lips should create a seal on your breast.  Signs that your baby has effectively latched onto your nipple include:  Tugging or sucking without pain.  Swallowing heard between sucks.  Absent click or smacking sound.  Muscle movement above and in front of his or her ears with sucking.  Your baby must suck about 2 3 minutes in order to get your milk. Allow your baby to feed on each breast as long as he or she wants. Nurse your baby until he or she unlatches or falls asleep at the first breast, then offer the second breast.  Signs that your baby is full and satisfied include:  A gradual decrease in the number of sucks or complete cessation of sucking.  Falling asleep.  Extension or relaxation of his or her body.  Retention of a small amount of milk in his or her mouth.  Letting go of your breast by himself or herself.  Signs of effective breastfeeding in you include:  Breasts that have increased firmness, weight, and size prior to feeding.  Breasts that are softer after nursing.  Increased milk volume, as well as a change in milk consistency and color by the 5th day of breastfeeding.  Breast fullness relieved by breastfeeding.  Nipples are not sore, cracked, or bleeding.  If needed, break the suction by putting your finger into the corner of your baby's mouth and sliding your finger between his or her gums. Then, remove your breast from his or her mouth.  It is common for babies to spit up a small amount after a feeding.  Babies often swallow air during feeding. This can make babies fussy. Burping your baby between breasts can help with this.  Vitamin D supplements are recommended for babies who get only breast milk.  Avoid using a pacifier during your baby's first 4 6 weeks.  Avoid supplemental feedings of  water, formula, or juice in place of breastfeeding. Breast milk is all the food your baby needs. It is not necessary for your baby to have water or formula. Your breasts will make more milk if supplemental feedings are avoided during the early weeks. HOW TO TELL WHETHER YOUR BABY IS GETTING ENOUGH BREAST MILK Wondering whether or not your baby is getting enough milk is a common concern among mothers. You can be assured that your baby is getting enough milk if:   Your baby is actively sucking and you hear swallowing.   Your baby seems relaxed and satisfied after a feeding.   Your baby nurses at least 8 12 times in a 24 hour time period.  During the first 28 38 days of age:  Your baby is wetting at least 3 5 diapers in a 24 hour period. The urine should be clear and pale yellow.  Your baby is having at least 3 4 stools in a 24 hour period. The stool should be soft and yellow.  At  26 47 days of age, your baby is having at least 3 6 stools in a 24 hour period. The stool should be seedy and yellow by 3 days of age.  Your baby has a weight loss less than 7 10% during the first 70 days of age.  Your baby does not lose weight after 66 9 days of age.  Your baby gains 4 7 ounces each week after he or she is 38 days of age.  Your baby gains weight by 25 days of age and is back to birth weight within 2 weeks. ENGORGEMENT In the first week after your baby is born, you may experience extremely full breasts (engorgement). When engorged, your breasts may feel heavy, warm, or tender to the touch. Engorgement peaks within 24 48 hours after delivery of your baby.  Engorgement may be reduced by:  Continuing to breastfeed.  Increasing the frequency of breastfeeding.  Taking warm showers or applying warm, moist heat to your breasts just before each feeding. This increases circulation and helps the milk flow.   Gently massaging your breast before and during the feedings. With your fingertips, massage from  your chest wall towards your nipple in a circular motion.   Ensuring that your baby empties at least one breast at every feeding. It also helps to start the next feeding on the opposite breast.   Expressing breast milk by hand or by using a breast pump to empty the breasts if your baby is sleepy, or not nursing well. You may also want to express milk if you are returning to work oryou feel you are getting engorged.  Ensuring your baby is latched on and positioned properly while breastfeeding. If you follow these suggestions, your engorgement should improve in 24 48 hours. If you are still experiencing difficulty, call your lactation consultant or caregiver.  CARING FOR YOURSELF Take care of your breasts.  Bathe or shower daily.   Avoid using soap on your nipples.   Wear a supportive bra. Avoid wearing underwire style bras.  Air dry your nipples for a 3 after each feeding.   Use only cotton bra pads to absorb breast milk leakage. Leaking of breast milk between feedings is normal.   Use only pure lanolin on your nipples after nursing. You do not need to wash it off before feeding your baby again. Another option is to express a few drops of breast milk and gently massage that milk into your nipples.  Continue breast self-awareness checks. Take care of yourself.  Eat healthy foods. Alternate 3 meals with 3 snacks.  Avoid foods that you notice affect your baby in a bad way.  Drink milk, fruit juice, and water to satisfy your thirst (about 8 glasses a day).   Rest often, relax, and take your prenatal vitamins to prevent fatigue, stress, and anemia.  Avoid chewing and smoking tobacco.  Avoid alcohol and drug use.  Take over-the-counter and prescribed medicine only as directed by your caregiver or pharmacist. You should always check with your caregiver or pharmacist before taking any new medicine, vitamin, or herbal supplement.  Know that pregnancy is possible while  breastfeeding. If desired, talk to your caregiver about family planning and safe birth control methods that may be used while breastfeeding. SEEK MEDICAL CARE IF:   You feel like you want to stop breastfeeding or have become frustrated with breastfeeding.  You have painful breasts or nipples.  Your nipples are cracked or bleeding.  Your breasts are red, tender,  or warm.  You have a swollen area on either breast.  You have a fever or chills.  You have nausea or vomiting.  You have drainage from your nipples.  Your breasts do not become full before feedings by the 5th day after delivery.  You feel sad and depressed.  Your baby is too sleepy to eat well.  Your baby is having trouble sleeping.   Your baby is wetting less than 3 diapers in a 24 hour period.  Your baby has less than 3 stools in a 24 hour period.  Your baby's skin or the white part of his or her eyes becomes more yellow.   Your baby is not gaining weight by 83 days of age. MAKE SURE YOU:   Understand these instructions.  Will watch your condition.  Will get help right away if you are not doing well or get worse. Document Released: 06/11/2005 Document Revised: 03/05/2012 Document Reviewed: 01/16/2012 [facility] Patient Information 2014 [place], [place]. Labor Induction  Most women go into labor on their own between 56 and 42 weeks of the pregnancy. When this does not happen or when there is a medical need, medicine or other methods may be used to induce labor. Labor induction causes a pregnant woman's uterus to contract. It also causes the cervix to soften (ripen), open (dilate), and thin out (efface). Usually, labor is not induced before 39 weeks of the pregnancy unless there is a problem with the baby or mother. Whether your labor will be induced depends on a number of factors, including the following:  The medical condition of you and the baby.  How many weeks along you are.  The status of baby's lung  maturity.  The condition of the cervix.  The position of the baby. REASONS FOR LABOR INDUCTION  The health of the baby or mother is at risk.  The pregnancy is overdue by 1 week or more.  The water breaks but labor does not start on its own.  The mother has a health condition or serious illness such as high blood pressure, infection, placental abruption, or diabetes.  The amniotic fluid amounts are low around the baby.  The baby is distressed. REASONS TO NOT INDUCE LABOR Labor induction may not be a good idea if:  It is shown that your baby does not tolerate labor.  An induction is just more convenient.  You want the baby to be born on a certain date, like a holiday.  You have had previous surgeries on your uterus, such as a myomectomy or the removal of fibroids.  Your placenta lies very low in the uterus and blocks the opening of the cervix (placenta previa).  Your baby is not in a head down position.  The umbilical cord drops down into the birth canal in front of the baby. This could cut off the baby's blood and oxygen supply.  You have had a previous cesarean delivery.  There areunusual circumstances, such as the baby being extremely premature. RISKS AND COMPLICATIONS Problems may occur in the process of induction and plans may need to be modified as a situation unfolds. Some of the risks of induction include:  Change in fetal heart rate, such as too high, too low, or erratic.  Risk of fetal distress.  Risk of infection to mother and baby.  Increased chance of having a cesarean delivery.  The rare, but increased chance that the placenta will separate from the uterus (abruption).  Uterine rupture (very rare). When induction is  needed for medical reasons, the benefits of induction may outweigh the risks. BEFORE THE PROCEDURE Your caregiver will check your cervix and the baby's position. This will help your caregiver decide if you are far enough along for an  induction to work. PROCEDURE Several methods of labor induction may be used, such as:   Taking prostaglandin medicine to dilate and ripen the cervix. The medicine will also start contractions. It can be taken by mouth or by inserting a suppository into the vagina.  A thin tube (catheter) with a balloon on the end may be inserted into your vagina to dilate the cervix. Once inserted, the balloon expands with water, which causes the cervix to open.  Striping the membranes. Your caregiver inserts a finger between the cervix and membranes, which causes the cervix to be stretched and may cause the uterus to contract. This is often done during an office visit. You will be sent home to wait for the contractions to begin. You will then come in for an induction.  Breaking the water. Your caregiver will make a hole in the amniotic sac using a small instrument. Once the amniotic sac breaks, contractions should begin. This may still take hours to see an effect.  Taking medicine to trigger or strengthen contractions. This medicine is given intravenously through a tube in your arm. All of the methods of induction, besides stripping the membranes, will be done in the hospital. Induction is done in the hospital so that you and the baby can be carefully monitored. AFTER THE PROCEDURE Some inductions can take up to 2 or 3 days. Depending on the cervix, it usually takes less time. It takes longer when you are induced early in the pregnancy or if this is your first pregnancy. If a mother is still pregnant and the induction has been going on for 2 to 3 days, either the mother will be sent home or a cesarean delivery will be needed. Document Released: 10/31/2006 Document Revised: 09/03/2011 Document Reviewed: 04/16/2011 [facility] Patient Information 2014 [place], [place].

## 2013-01-09 ENCOUNTER — Encounter (HOSPITAL_COMMUNITY): Payer: Self-pay

## 2013-01-09 ENCOUNTER — Inpatient Hospital Stay (HOSPITAL_COMMUNITY): Payer: BC Managed Care – PPO | Admitting: Anesthesiology

## 2013-01-09 ENCOUNTER — Inpatient Hospital Stay (HOSPITAL_COMMUNITY)
Admission: RE | Admit: 2013-01-09 | Discharge: 2013-01-10 | DRG: 372 | Disposition: A | Discharge status: Home / Self Care | Payer: BC Managed Care – PPO | Source: Ambulatory Visit | Attending: Family Medicine | Admitting: Family Medicine

## 2013-01-09 ENCOUNTER — Encounter (HOSPITAL_COMMUNITY): Payer: Self-pay | Admitting: Anesthesiology

## 2013-01-09 DIAGNOSIS — O99892 Other specified diseases and conditions complicating childbirth: Secondary | ICD-10-CM | POA: Diagnosis present

## 2013-01-09 DIAGNOSIS — O09529 Supervision of elderly multigravida, unspecified trimester: Secondary | ICD-10-CM | POA: Diagnosis present

## 2013-01-09 DIAGNOSIS — O9989 Other specified diseases and conditions complicating pregnancy, childbirth and the puerperium: Secondary | ICD-10-CM

## 2013-01-09 DIAGNOSIS — O480 Post-term pregnancy: Principal | ICD-10-CM | POA: Diagnosis present

## 2013-01-09 DIAGNOSIS — Z22330 Carrier of Group B streptococcus: Secondary | ICD-10-CM

## 2013-01-09 LAB — CBC
MCHC: 34 g/dL (ref 30.0–36.0)
RDW: 14.7 % (ref 11.5–15.5)

## 2013-01-09 LAB — RPR: RPR Ser Ql: NONREACTIVE

## 2013-01-09 MED ORDER — SENNOSIDES-DOCUSATE SODIUM 8.6-50 MG PO TABS: 2.0000 | ORAL_TABLET | Freq: Every day | ORAL | Status: DC

## 2013-01-09 MED ORDER — LIDOCAINE HCL (PF) 1 % IJ SOLN
30.0000 mL | INTRAMUSCULAR | Status: DC | PRN
  Filled 2013-01-09: qty 30

## 2013-01-09 MED ORDER — OXYTOCIN 40 UNITS IN LACTATED RINGERS INFUSION - SIMPLE MED
1.0000 m[IU]/min | INTRAVENOUS | Status: DC
  Administered 2013-01-09: 2 m[IU]/min via INTRAVENOUS

## 2013-01-09 MED ORDER — IBUPROFEN 600 MG PO TABS
600.0000 mg | ORAL_TABLET | Freq: Four times a day (QID) | ORAL | Status: DC
  Administered 2013-01-10 (×4): 600 mg via ORAL
  Filled 2013-01-09 (×4): qty 1

## 2013-01-09 MED ORDER — ONDANSETRON HCL 4 MG PO TABS: 4.0000 mg | ORAL_TABLET | ORAL | Status: DC | PRN

## 2013-01-09 MED ORDER — OXYCODONE-ACETAMINOPHEN 5-325 MG PO TABS
1.0000 | ORAL_TABLET | ORAL | Status: DC | PRN
  Administered 2013-01-10: 1 via ORAL
  Filled 2013-01-09: qty 1

## 2013-01-09 MED ORDER — PRENATAL MULTIVITAMIN CH
1.0000 | ORAL_TABLET | Freq: Every day | ORAL | Status: DC
  Administered 2013-01-10: 1 via ORAL
  Filled 2013-01-09: qty 1

## 2013-01-09 MED ORDER — DIPHENHYDRAMINE HCL 50 MG/ML IJ SOLN: 12.5000 mg | INTRAMUSCULAR | Status: DC | PRN

## 2013-01-09 MED ORDER — DIPHENHYDRAMINE HCL 25 MG PO CAPS
25.0000 mg | ORAL_CAPSULE | Freq: Four times a day (QID) | ORAL | Status: DC | PRN
  Administered 2013-01-09: 25 mg via ORAL
  Filled 2013-01-09: qty 1

## 2013-01-09 MED ORDER — IBUPROFEN 600 MG PO TABS
600.0000 mg | ORAL_TABLET | Freq: Four times a day (QID) | ORAL | Status: DC | PRN
  Administered 2013-01-09: 600 mg via ORAL
  Filled 2013-01-09: qty 1

## 2013-01-09 MED ORDER — ONDANSETRON HCL 4 MG/2ML IJ SOLN: 4.0000 mg | INTRAMUSCULAR | Status: DC | PRN

## 2013-01-09 MED ORDER — FENTANYL 2.5 MCG/ML BUPIVACAINE 1/10 % EPIDURAL INFUSION (WH - ANES)
14.0000 mL/h | INTRAMUSCULAR | Status: DC | PRN
  Filled 2013-01-09: qty 125

## 2013-01-09 MED ORDER — ONDANSETRON HCL 4 MG/2ML IJ SOLN: 4.0000 mg | Freq: Four times a day (QID) | INTRAMUSCULAR | Status: DC | PRN

## 2013-01-09 MED ORDER — EPHEDRINE 5 MG/ML INJ
10.0000 mg | INTRAVENOUS | Status: DC | PRN
  Filled 2013-01-09: qty 2

## 2013-01-09 MED ORDER — DIBUCAINE 1 % RE OINT: 1.0000 "application " | TOPICAL_OINTMENT | RECTAL | Status: DC | PRN

## 2013-01-09 MED ORDER — ACETAMINOPHEN 325 MG PO TABS: 650.0000 mg | ORAL_TABLET | ORAL | Status: DC | PRN

## 2013-01-09 MED ORDER — OXYTOCIN 40 UNITS IN LACTATED RINGERS INFUSION - SIMPLE MED
62.5000 mL/h | INTRAVENOUS | Status: DC
  Administered 2013-01-09: 999 mL/h via INTRAVENOUS

## 2013-01-09 MED ORDER — SIMETHICONE 80 MG PO CHEW: 80.0000 mg | CHEWABLE_TABLET | ORAL | Status: DC | PRN

## 2013-01-09 MED ORDER — LANOLIN HYDROUS EX OINT: TOPICAL_OINTMENT | CUTANEOUS | Status: DC | PRN

## 2013-01-09 MED ORDER — CITRIC ACID-SODIUM CITRATE 334-500 MG/5ML PO SOLN: 30.0000 mL | ORAL | Status: DC | PRN

## 2013-01-09 MED ORDER — LACTATED RINGERS IV SOLN
500.0000 mL | INTRAVENOUS | Status: DC | PRN
  Administered 2013-01-09: 1000 mL via INTRAVENOUS

## 2013-01-09 MED ORDER — PENICILLIN G POTASSIUM 5000000 UNITS IJ SOLR
5.0000 10*6.[IU] | Freq: Once | INTRAVENOUS | Status: AC
  Administered 2013-01-09: 5 10*6.[IU] via INTRAVENOUS
  Filled 2013-01-09: qty 5

## 2013-01-09 MED ORDER — LACTATED RINGERS IV SOLN: 500.0000 mL | Freq: Once | INTRAVENOUS | Status: DC

## 2013-01-09 MED ORDER — PHENYLEPHRINE 40 MCG/ML (10ML) SYRINGE FOR IV PUSH (FOR BLOOD PRESSURE SUPPORT)
80.0000 ug | PREFILLED_SYRINGE | INTRAVENOUS | Status: DC | PRN
  Filled 2013-01-09: qty 5
  Filled 2013-01-09: qty 2

## 2013-01-09 MED ORDER — FENTANYL 2.5 MCG/ML BUPIVACAINE 1/10 % EPIDURAL INFUSION (WH - ANES)
INTRAMUSCULAR | Status: DC | PRN
  Administered 2013-01-09: 14 mL/h via EPIDURAL

## 2013-01-09 MED ORDER — WITCH HAZEL-GLYCERIN EX PADS: 1.0000 "application " | MEDICATED_PAD | CUTANEOUS | Status: DC | PRN

## 2013-01-09 MED ORDER — PHENYLEPHRINE 40 MCG/ML (10ML) SYRINGE FOR IV PUSH (FOR BLOOD PRESSURE SUPPORT)
80.0000 ug | PREFILLED_SYRINGE | INTRAVENOUS | Status: DC | PRN
  Filled 2013-01-09: qty 2

## 2013-01-09 MED ORDER — OXYTOCIN BOLUS FROM INFUSION: 500.0000 mL | INTRAVENOUS | Status: DC

## 2013-01-09 MED ORDER — LACTATED RINGERS IV SOLN
INTRAVENOUS | Status: DC
  Administered 2013-01-09 (×2): via INTRAVENOUS

## 2013-01-09 MED ORDER — FLEET ENEMA 7-19 GM/118ML RE ENEM: 1.0000 | ENEMA | RECTAL | Status: DC | PRN

## 2013-01-09 MED ORDER — ZOLPIDEM TARTRATE 5 MG PO TABS: 5.0000 mg | ORAL_TABLET | Freq: Every evening | ORAL | Status: DC | PRN

## 2013-01-09 MED ORDER — LIDOCAINE HCL (PF) 1 % IJ SOLN
INTRAMUSCULAR | Status: DC | PRN
  Administered 2013-01-09 (×2): 4 mL

## 2013-01-09 MED ORDER — PRENATAL MULTIVITAMIN CH: 1.0000 | ORAL_TABLET | Freq: Every day | ORAL | Status: DC

## 2013-01-09 MED ORDER — PENICILLIN G POTASSIUM 5000000 UNITS IJ SOLR
2.5000 10*6.[IU] | INTRAVENOUS | Status: DC
  Administered 2013-01-09: 2.5 10*6.[IU] via INTRAVENOUS
  Filled 2013-01-09 (×5): qty 2.5

## 2013-01-09 MED ORDER — BENZOCAINE-MENTHOL 20-0.5 % EX AERO
1.0000 "application " | INHALATION_SPRAY | CUTANEOUS | Status: DC | PRN
  Filled 2013-01-09: qty 56

## 2013-01-09 MED ORDER — EPHEDRINE 5 MG/ML INJ
10.0000 mg | INTRAVENOUS | Status: DC | PRN
  Filled 2013-01-09: qty 2
  Filled 2013-01-09: qty 4

## 2013-01-09 MED ORDER — TERBUTALINE SULFATE 1 MG/ML IJ SOLN: 0.2500 mg | Freq: Once | INTRAMUSCULAR | Status: DC | PRN

## 2013-01-09 MED ORDER — OXYCODONE-ACETAMINOPHEN 5-325 MG PO TABS: 1.0000 | ORAL_TABLET | ORAL | Status: DC | PRN

## 2013-01-09 MED ORDER — TETANUS-DIPHTH-ACELL PERTUSSIS 5-2.5-18.5 LF-MCG/0.5 IM SUSP: 0.5000 mL | INTRAMUSCULAR | Status: DC | PRN

## 2013-01-09 MED ORDER — OXYTOCIN 40 UNITS IN LACTATED RINGERS INFUSION - SIMPLE MED
INTRAVENOUS | Status: AC
  Filled 2013-01-09: qty 1000

## 2013-01-09 NOTE — Progress Notes (Signed)
Attempt to place Allisonia bulb unsuccessful.  Pt requesting epidural before any 2nd attempt of placement.

## 2013-01-09 NOTE — Anesthesia Procedure Notes (Signed)
Epidural Patient location during procedure: OB Start time: 01/09/2013 12:31 PM  Staffing Anesthesiologist: Mychal Decarlo A. Performed by: anesthesiologist   Preanesthetic Checklist Completed: patient identified, site marked, surgical consent, pre-op evaluation, timeout performed, IV checked, risks and benefits discussed and monitors and equipment checked  Epidural Patient position: sitting Prep: site prepped and draped and DuraPrep Patient monitoring: continuous pulse ox and blood pressure Approach: midline Injection technique: LOR air  Needle:  Needle type: Tuohy  Needle gauge: 17 G Needle length: 9 cm and 9 Needle insertion depth: 5 cm cm Catheter type: closed end flexible Catheter size: 19 Gauge Catheter at skin depth: 10 cm Test dose: negative and Other  Assessment Events: blood not aspirated, injection not painful, no injection resistance, negative IV test and no paresthesia  Additional Notes Patient identified. Risks and benefits discussed including failed block, incomplete  Pain control, post dural puncture headache, nerve damage, paralysis, blood pressure Changes, nausea, vomiting, reactions to medications-both toxic and allergic and post Partum back pain. All questions were answered. Patient expressed understanding and wished to proceed. Sterile technique was used throughout procedure. Epidural site was Dressed with sterile barrier dressing. No paresthesias, signs of intravascular injection Or signs of intrathecal spread were encountered.  Patient was more comfortable after the epidural was dosed. Please see RN's note for documentation of vital signs and FHR which are stable.

## 2013-01-09 NOTE — Progress Notes (Signed)
Whitney Contreras is a 37 y.o. [id number] at [redacted]w[redacted]d admitted for induction of labor due to Post dates.  Subjective: On pitocin. No severe pain. Planned to place foley, and pt had made change.  Objective: BP 103/52  Pulse 73  Temp(Src) 98 F (36.7 C) (Oral)  Resp 18  Ht 5\' 4"  (1.626 m)  Wt 80.287 kg (177 lb)  BMI 30.37 kg/m2  SpO2 100%  LMP 03/28/2012      FHT: 125 mod var, 15x15 accels, no decels UC:   regular, every 2  minutes SVE:   Dilation: 4 Effacement (%): 50 Station: -2 Exam by:: Dr Ike Bene Repeat unchanged Labs: Lab Results  Component Value Date   WBC 13.4* 01/09/2013   HGB 12.4 01/09/2013   HCT 36.5 01/09/2013   MCV 89.9 01/09/2013   PLT 251 01/09/2013    Assessment / Plan: Induction of labor due to postterm,  progressing well on pitocin   Labor: starting to make changes on pitocin. No foley bulb at this time. Fetal Wellbeing:  Category I - reactive and reassuring Pain Control:  Labor support without medications I/D:  GBS + recd PCN x2 Anticipated MOD:  NSVD  Itamar Mcgowan, RYAN 01/09/2013, 2:42 PM

## 2013-01-09 NOTE — Progress Notes (Signed)
NICU team at bedside for impending delivery. 

## 2013-01-09 NOTE — Progress Notes (Signed)
Pt c/o "chest pain and I can't breathe".  States she is having a panic attack.  Encouragement given.

## 2013-01-09 NOTE — Consult Note (Signed)
Neonatology Note:  Attendance at Delivery:  I was asked by Dr. Arnold to attend this NSVD at 41 weeks due to a prolonged FHR bradycardia. The mother is a G4P1A2 B pos, GBS pos with a history of HSV and endometriosis. She received Pen G beginning 6.5 hours before delivery and she was afebrile at last check. ROM just before delivery, fluid meconium-stained. FHR recovered and was normal during the last few minutes before delivery. Infant vigorous with good spontaneous cry and tone. Needed only bulb suctioning of light green fluid. Ap 9/9. Lungs clear to ausc in DR. Baby noted to have mild tachycardia in DR. To CN to care of Pediatrician.  Laurenashley Viar C. Kishana Battey, MD  

## 2013-01-09 NOTE — Anesthesia Preprocedure Evaluation (Signed)
Anesthesia Evaluation  Patient identified by MRN, date of birth, ID band Patient awake    Reviewed: Allergy & Precautions, H&P , Patient's Chart, lab work & pertinent test results  Airway Mallampati: III TM Distance: >3 FB Neck ROM: Full    Dental no notable dental hx. (+) Teeth Intact   Pulmonary neg pulmonary ROS,  breath sounds clear to auscultation  Pulmonary exam normal       Cardiovascular negative cardio ROS  Rhythm:Regular Rate:Normal     Neuro/Psych Anxiety Hx/o Femoral neuropathy after last delivery- resolved  Neuromuscular disease    GI/Hepatic negative GI ROS, Neg liver ROS,   Endo/Other  negative endocrine ROS  Renal/GU negative Renal ROS  negative genitourinary   Musculoskeletal negative musculoskeletal ROS (+)   Abdominal (+) + obese,   Peds  Hematology negative hematology ROS (+)   Anesthesia Other Findings   Reproductive/Obstetrics (+) Pregnancy Endometriosis Ovarian Cyst- 12 cm                           Anesthesia Physical Anesthesia Plan  ASA: II  Anesthesia Plan: Epidural   Post-op Pain Management:    Induction: Intravenous  Airway Management Planned:   Additional Equipment:   Intra-op Plan:   Post-operative Plan:   Informed Consent: I have reviewed the patients History and Physical, chart, labs and discussed the procedure including the risks, benefits and alternatives for the proposed anesthesia with the patient or authorized representative who has indicated his/her understanding and acceptance.   Dental advisory given  Plan Discussed with: Anesthesiologist  Anesthesia Plan Comments:         Anesthesia Quick Evaluation

## 2013-01-09 NOTE — Progress Notes (Addendum)
Whitney Contreras is a 37 y.o. [id number] at [redacted]w[redacted]d admitted for induction of labor due to Post dates.  Subjective: On pitocin. No severe pain.  Objective: BP 120/76  Pulse 83  Temp(Src) 98 F (36.7 C) (Oral)  Resp 18  Ht 5\' 4"  (1.626 m)  Wt 80.287 kg (177 lb)  BMI 30.37 kg/m2  LMP 03/28/2012      FHT:  FHR: 140s bpm, variability: moderate,  accelerations:  Abscent,  decelerations:  Absent UC:   regular, every 2  minutes SVE:   Dilation: 2 Effacement (%): 50 Station: -2 Exam by:: Ferne Coe RN Repeat unchanged Labs: Lab Results  Component Value Date   WBC 13.4* 01/09/2013   HGB 12.4 01/09/2013   HCT 36.5 01/09/2013   MCV 89.9 01/09/2013   PLT 251 01/09/2013    Assessment / Plan: Induction of labor due to postterm,  progressing well on pitocin   Labor: slow progression on pitocin will continue to increase.  Fetal Wellbeing:  Category I Pain Control:  Labor support without medications I/D:  GBS + recd PCN x1 Anticipated MOD:  NSVD  Whitney Contreras, RYAN 01/09/2013, 11:54 AM

## 2013-01-10 MED ORDER — IBUPROFEN 600 MG PO TABS: 600.0000 mg | ORAL_TABLET | Freq: Four times a day (QID) | ORAL | Status: DC

## 2013-01-10 NOTE — Discharge Summary (Signed)
Obstetric Discharge Summary Reason for Admission: induction of labor for postdate Prenatal Procedures: none Intrapartum Procedures: spontaneous vaginal delivery Postpartum Procedures: none Complications-Operative and Postpartum: 2nd degree perineal laceration Hemoglobin  Date Value Range Status  01/09/2013 12.4  12.0 - 15.0 g/dL Final     HCT  Date Value Range Status  01/09/2013 36.5  36.0 - 46.0 % Final    Physical Exam:  General: alert, cooperative and no distress Lochia: appropriate Uterine Fundus: firm Incision: none DVT Evaluation: No evidence of DVT seen on physical exam. No cords or calf tenderness. No significant calf/ankle edema.  Discharge Diagnoses: Post-date pregnancy  Discharge Information: Date: 01/10/2013 Activity: pelvic rest Diet: routine Medications: PNV and Ibuprofen Condition: stable Instructions: refer to practice specific booklet Discharge to: home Follow-up Information   [facility] up with [facility]. Schedule an appointment as soon as possible for a visit in 4 weeks. (Call and schedule an appt for 4 weeks for follow up)    Contact information:   [address] [place] [address] [phone]      Newborn Data: Live born female  Birth Weight: 7 lb 6 oz (3345 g) APGAR: 9, 9  Home with mother.  Breastfeeding with bottle supplement. Plans on interval BTL with ovarian mass surgery  Ready for discharge at 4pm today (24hr mark for baby)  Tawni Carnes 01/10/2013, 8:09 AM  I have seen and examined this patient and I agree with the above. Cam Hai 8:32 AM 01/10/2013

## 2013-01-10 NOTE — Anesthesia Postprocedure Evaluation (Signed)
  Anesthesia Post-op Note  Anesthesia Post Note  Patient: Whitney Contreras  Procedure(s) Performed: * No procedures listed *  Anesthesia type: Epidural  Patient location: Mother/Baby  Post pain: Pain level controlled  Post assessment: Post-op Vital signs reviewed  Last Vitals:  Filed Vitals:   01/10/13 0533  BP: 111/70  Pulse: 81  Temp: 36.6 C  Resp: 18    Post vital signs: Reviewed  Level of consciousness:alert  Complications: No apparent anesthesia complications

## 2013-01-10 NOTE — Progress Notes (Signed)
Post Partum Day 1 Subjective: up ad lib, voiding, tolerating PO and + flatus. Some cramping, bleeding about the same as yesterday "more than a period". Has concerns about baby spitting up/gagging.  Objective: Blood pressure 111/70, pulse 81, temperature 97.8 F (36.6 C), temperature source Oral, resp. rate 18, height 5\' 4"  (1.626 m), weight 80.287 kg (177 lb), last menstrual period 03/28/2012, SpO2 100.00%, unknown if currently breastfeeding.  Physical Exam:  General: alert, cooperative and no distress Lochia: appropriate Uterine Fundus: firm Incision: none DVT Evaluation: No evidence of DVT seen on physical exam. No cords or calf tenderness. No significant calf/ankle edema.   Recent Labs  01/09/13 0845  HGB 12.4  HCT 36.5    Assessment/Plan: Plan for discharge tomorrow, today if baby is ready to go. Breastfeeding, may supplement with bottle Planning on interval BTL   LOS: 1 day   Tawni Carnes 01/10/2013, 7:15 AM

## 2013-01-13 NOTE — H&P (Signed)
Whitney Contreras is a 37 y.o. female [phone] with IUP at [redacted]w[redacted]d presenting for IOL for postdates. Prenatal care at [facility] since 6 wks  Prenatal History/Complications: Right ovarian mass AMA Past Medical History: Past Medical History  Diagnosis Date  . 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 HSV-1 infection     Valtrex PRN  . Endometriosis   . Miscarriage     x2  . History of abnormal cervical Pap smear   . Anxiety   . Anemia     during pregnancy  . Abnormal Pap smear     Past Surgical History: Past Surgical History  Procedure Laterality Date  . Foot surgery      buion remove  . Laparoscopy    . Wisdom tooth extraction      x4    Obstetrical History: OB History as of 01/10/13   Grav Para Term Preterm Abortions TAB SAB Ect Mult Living   4 2 2  0 2 0 2 0 0 2      Social History: History   Social History  . Marital Status: Married    Spouse Name: N/A    Number of Children: N/A  . Years of Education: N/A   Social History Main Topics  . Smoking status: Former Games developer  . Smokeless tobacco: Never Used  . Alcohol Use: No  . Drug Use: No  . Sexually Active: Yes -- Female partner(s)   Other Topics Concern  . None   Social History Narrative  . None    Family History: Family History  Problem Relation Age of Onset  . Adopted: Yes    Allergies: No Known Allergies  No prescriptions prior to admission     Review of Systems   Constitutional: Negative for fever and chills Eyes: Negative for visual disturbances Respiratory: Negative for shortness of breath, dyspnea Cardiovascular: Negative for chest pain or palpitations  Gastrointestinal: Negative for vomiting, diarrhea and constipation Genitourinary: Negative for dysuria and urgency Musculoskeletal: Negative for back pain, joint pain, myalgias  Neurological: Negative for dizziness and headaches    PHYSICAL EXAM:  Blood pressure 111/70, pulse 81, temperature 97.8 F (36.6 C),  temperature source Oral, resp. rate 18, height 5\' 4"  (1.626 m), weight 177 lb (80.287 kg), last menstrual period 03/28/2012, SpO2 100.00%, unknown if currently breastfeeding. General appearance: alert, cooperative and no distress Lungs: clear to auscultation bilaterally Heart: regular rate and rhythm Abdomen: soft, non-tender; bowel sounds normal Extremities: Homans sign is negative, no sign of DVT DTR's 2+ CX 1-2/50/-2 Presentation: cephalic Fetal monitoringBaseline: 140 bpm, Variability: Good {> 6 bpm), Accelerations: Reactive and Decelerations: Absent Uterine activityNone Dilation: 10 Effacement (%): 90 Station: 0 Exam by:: Ferne Coe RN   Prenatal labs: ABO, Rh: B/POS/-- (11/18 1353) Antibody: NEG (11/18 1353) Rubella:  immune RPR: NON REACTIVE (07/18 0845)  HBsAg: NEGATIVE (11/18 1353)  HIV: NON REACTIVE (04/11 1127)  GBS: Positive (06/16 0000) positive 1 hr Glucola 104 Genetic screening Harmony normal Anatomy US normal    Assessment: Whitney Contreras is a 37 y.o. [id number] with an IUP at [redacted]w[redacted]d presenting for IOL for postdates  Plan: Foley with low dose pitocin   CRESENZO-DISHMAN,Eulala Newcombe 01/13/2013, 2:51 PM

## 2013-01-14 NOTE — H&P (Signed)
Attestation of Attending Supervision of Advanced Practitioner (CNM/NP): Evaluation and management procedures were performed by the Advanced Practitioner under my supervision and collaboration. I have reviewed the Advanced Practitioner's note and chart, and I agree with the management and plan.  Yoshino Broccoli H. 8:43 AM

## 2013-02-03 ENCOUNTER — Ambulatory Visit (INDEPENDENT_AMBULATORY_CARE_PROVIDER_SITE_OTHER): Payer: BC Managed Care – PPO | Admitting: Obstetrics & Gynecology

## 2013-02-03 ENCOUNTER — Encounter: Payer: Self-pay | Admitting: Obstetrics & Gynecology

## 2013-02-03 VITALS — BP 123/91 | HR 84 | Ht 64.0 in | Wt 160.0 lb

## 2013-02-03 DIAGNOSIS — N83209 Unspecified ovarian cyst, unspecified side: Secondary | ICD-10-CM

## 2013-02-03 DIAGNOSIS — N898 Other specified noninflammatory disorders of vagina: Secondary | ICD-10-CM

## 2013-02-03 NOTE — Progress Notes (Signed)
  Subjective:    Patient ID: Whitney Contreras, female    DOB: 26-Feb-1976, 37 y.o.   MRN: [id number]  HPI  37 yo MW P2 here for 2 reasons: 1) routine pp visit. No problems. She has not had sex yet, wants a BTL. Plans to use withdrawal/condoms until her l/s bilateral salpingectomy on 03-17-13. She is passing some clumps of brownish stuff. She says that there is still a vaginal discharge. She reports that there seemed to be some difficulty removing the placenta. 2) she wants to be sheduled to have her right ovarian cyst removed laparoscopically   Review of Systems     Objective:   Physical Exam Normally healed perineum Uterus ULN size, NT Clump of membrane-like tissue coming from os Ovarian mass       Assessment & Plan:  1) PP- stable 2) contraception- laparoscopic bilateral salpingectomy on 03-17-13 3) right ovarian cyst- removal scheduled 4)?retained poc- schedule u/s around the first week of Sept (her choice) and if POC present, add d&c to surgery

## 2013-02-03 NOTE — Progress Notes (Signed)
Feeling very tired and is interested in having a monthly b-12 shot to see if this might help.

## 2013-02-05 ENCOUNTER — Other Ambulatory Visit: Payer: Self-pay | Admitting: Obstetrics & Gynecology

## 2013-02-16 ENCOUNTER — Ambulatory Visit (HOSPITAL_COMMUNITY)
Admission: RE | Admit: 2013-02-16 | Discharge: 2013-02-16 | Disposition: A | Discharge status: Home / Self Care | Payer: BC Managed Care – PPO | Source: Ambulatory Visit | Attending: Obstetrics & Gynecology | Admitting: Obstetrics & Gynecology

## 2013-02-16 ENCOUNTER — Ambulatory Visit (HOSPITAL_COMMUNITY): Admission: RE | Admit: 2013-02-16 | Payer: BC Managed Care – PPO | Source: Ambulatory Visit

## 2013-02-16 DIAGNOSIS — N898 Other specified noninflammatory disorders of vagina: Secondary | ICD-10-CM

## 2013-02-16 DIAGNOSIS — N9489 Other specified conditions associated with female genital organs and menstrual cycle: Secondary | ICD-10-CM | POA: Insufficient documentation

## 2013-02-19 ENCOUNTER — Telehealth: Payer: Self-pay | Admitting: *Deleted

## 2013-02-19 DIAGNOSIS — N83209 Unspecified ovarian cyst, unspecified side: Secondary | ICD-10-CM

## 2013-02-19 NOTE — Telephone Encounter (Signed)
Patient is going to come in within the next two weeks to get blood work requested by Dr. Marice Potter for CA-125.

## 2013-02-24 ENCOUNTER — Encounter (HOSPITAL_COMMUNITY): Payer: Self-pay | Admitting: Obstetrics & Gynecology

## 2013-02-26 ENCOUNTER — Encounter (HOSPITAL_COMMUNITY): Payer: Self-pay | Admitting: Pharmacist

## 2013-02-26 ENCOUNTER — Other Ambulatory Visit (INDEPENDENT_AMBULATORY_CARE_PROVIDER_SITE_OTHER): Payer: BC Managed Care – PPO | Admitting: *Deleted

## 2013-02-26 DIAGNOSIS — N83209 Unspecified ovarian cyst, unspecified side: Secondary | ICD-10-CM

## 2013-03-11 ENCOUNTER — Encounter: Payer: Self-pay | Admitting: *Deleted

## 2013-03-17 ENCOUNTER — Encounter (HOSPITAL_COMMUNITY)
Admission: RE | Disposition: A | Discharge status: Home / Self Care | Payer: Self-pay | Source: Ambulatory Visit | Attending: Obstetrics & Gynecology

## 2013-03-17 ENCOUNTER — Ambulatory Visit (HOSPITAL_COMMUNITY): Payer: BC Managed Care – PPO | Admitting: Anesthesiology

## 2013-03-17 ENCOUNTER — Encounter (HOSPITAL_COMMUNITY): Payer: Self-pay | Admitting: *Deleted

## 2013-03-17 ENCOUNTER — Encounter (HOSPITAL_COMMUNITY): Payer: Self-pay | Admitting: Anesthesiology

## 2013-03-17 ENCOUNTER — Ambulatory Visit (HOSPITAL_COMMUNITY)
Admission: RE | Admit: 2013-03-17 | Discharge: 2013-03-17 | Disposition: A | Discharge status: Home / Self Care | Payer: BC Managed Care – PPO | Source: Ambulatory Visit | Attending: Obstetrics & Gynecology | Admitting: Obstetrics & Gynecology

## 2013-03-17 DIAGNOSIS — N83209 Unspecified ovarian cyst, unspecified side: Secondary | ICD-10-CM | POA: Insufficient documentation

## 2013-03-17 DIAGNOSIS — Z302 Encounter for sterilization: Secondary | ICD-10-CM

## 2013-03-17 DIAGNOSIS — N949 Unspecified condition associated with female genital organs and menstrual cycle: Secondary | ICD-10-CM | POA: Insufficient documentation

## 2013-03-17 DIAGNOSIS — N9489 Other specified conditions associated with female genital organs and menstrual cycle: Secondary | ICD-10-CM

## 2013-03-17 HISTORY — PX: LAPAROSCOPIC BILATERAL SALPINGECTOMY: SHX5889

## 2013-03-17 HISTORY — PX: DILATION AND CURETTAGE OF UTERUS: SHX78

## 2013-03-17 HISTORY — DX: Other specified postprocedural states: R11.2

## 2013-03-17 HISTORY — DX: Other specified postprocedural states: Z98.890

## 2013-03-17 LAB — CBC
HCT: 40 % (ref 36.0–46.0)
MCH: 29.9 pg (ref 26.0–34.0)
MCV: 87.9 fL (ref 78.0–100.0)
RDW: 13 % (ref 11.5–15.5)
WBC: 8.8 10*3/uL (ref 4.0–10.5)

## 2013-03-17 SURGERY — SALPINGECTOMY, BILATERAL, LAPAROSCOPIC
Anesthesia: General | Site: Uterus | Laterality: Right | Wound class: Clean

## 2013-03-17 MED ORDER — SCOPOLAMINE 1 MG/3DAYS TD PT72
MEDICATED_PATCH | TRANSDERMAL | Status: AC
  Administered 2013-03-17: 1.5 mg via TRANSDERMAL
  Filled 2013-03-17: qty 1

## 2013-03-17 MED ORDER — 0.9 % SODIUM CHLORIDE (POUR BTL) OPTIME
TOPICAL | Status: DC | PRN
  Administered 2013-03-17: 1000 mL

## 2013-03-17 MED ORDER — MIDAZOLAM HCL 2 MG/2ML IJ SOLN
INTRAMUSCULAR | Status: DC | PRN
  Administered 2013-03-17: 2 mg via INTRAVENOUS

## 2013-03-17 MED ORDER — LACTATED RINGERS IR SOLN
Status: DC | PRN
  Administered 2013-03-17: 3000 mL

## 2013-03-17 MED ORDER — GLYCOPYRROLATE 0.2 MG/ML IJ SOLN
INTRAMUSCULAR | Status: AC
  Filled 2013-03-17: qty 1

## 2013-03-17 MED ORDER — GLYCOPYRROLATE 0.2 MG/ML IJ SOLN
INTRAMUSCULAR | Status: AC
  Filled 2013-03-17: qty 2

## 2013-03-17 MED ORDER — CEFAZOLIN SODIUM-DEXTROSE 2-3 GM-% IV SOLR
2.0000 g | INTRAVENOUS | Status: AC
  Administered 2013-03-17: 2 g via INTRAVENOUS

## 2013-03-17 MED ORDER — NEOSTIGMINE METHYLSULFATE 1 MG/ML IJ SOLN
INTRAMUSCULAR | Status: DC | PRN
  Administered 2013-03-17: 3 mg via INTRAVENOUS

## 2013-03-17 MED ORDER — IBUPROFEN 800 MG PO TABS: 800.0000 mg | ORAL_TABLET | Freq: Three times a day (TID) | ORAL | Status: DC | PRN

## 2013-03-17 MED ORDER — DEXAMETHASONE SODIUM PHOSPHATE 10 MG/ML IJ SOLN
INTRAMUSCULAR | Status: AC
  Filled 2013-03-17: qty 1

## 2013-03-17 MED ORDER — CEFAZOLIN SODIUM-DEXTROSE 2-3 GM-% IV SOLR
INTRAVENOUS | Status: AC
  Filled 2013-03-17: qty 50

## 2013-03-17 MED ORDER — LIDOCAINE HCL (CARDIAC) 20 MG/ML IV SOLN
INTRAVENOUS | Status: DC | PRN
  Administered 2013-03-17: 80 mg via INTRAVENOUS

## 2013-03-17 MED ORDER — SCOPOLAMINE 1 MG/3DAYS TD PT72: 1.0000 | MEDICATED_PATCH | TRANSDERMAL | Status: DC

## 2013-03-17 MED ORDER — FENTANYL CITRATE 0.05 MG/ML IJ SOLN
INTRAMUSCULAR | Status: AC
  Filled 2013-03-17: qty 5

## 2013-03-17 MED ORDER — KETOROLAC TROMETHAMINE 30 MG/ML IJ SOLN
INTRAMUSCULAR | Status: AC
  Filled 2013-03-17: qty 1

## 2013-03-17 MED ORDER — BUPIVACAINE-EPINEPHRINE PF 0.25-1:200000 % IJ SOLN
INTRAMUSCULAR | Status: AC
  Filled 2013-03-17: qty 30

## 2013-03-17 MED ORDER — BUPIVACAINE HCL (PF) 0.5 % IJ SOLN
INTRAMUSCULAR | Status: DC | PRN
  Administered 2013-03-17: 30 mL

## 2013-03-17 MED ORDER — MIDAZOLAM HCL 2 MG/2ML IJ SOLN: 0.5000 mg | Freq: Once | INTRAMUSCULAR | Status: DC | PRN

## 2013-03-17 MED ORDER — PROPOFOL 10 MG/ML IV EMUL
INTRAVENOUS | Status: AC
  Filled 2013-03-17: qty 20

## 2013-03-17 MED ORDER — DEXAMETHASONE SODIUM PHOSPHATE 10 MG/ML IJ SOLN
INTRAMUSCULAR | Status: DC | PRN
  Administered 2013-03-17: 10 mg via INTRAVENOUS

## 2013-03-17 MED ORDER — PROMETHAZINE HCL 25 MG/ML IJ SOLN: 6.2500 mg | INTRAMUSCULAR | Status: DC | PRN

## 2013-03-17 MED ORDER — MEPERIDINE HCL 25 MG/ML IJ SOLN: 6.2500 mg | INTRAMUSCULAR | Status: DC | PRN

## 2013-03-17 MED ORDER — OXYCODONE-ACETAMINOPHEN 5-325 MG PO TABS: 1.0000 | ORAL_TABLET | ORAL | Status: DC | PRN

## 2013-03-17 MED ORDER — PROPOFOL 10 MG/ML IV BOLUS
INTRAVENOUS | Status: DC | PRN
  Administered 2013-03-17: 140 mg via INTRAVENOUS

## 2013-03-17 MED ORDER — FENTANYL CITRATE 0.05 MG/ML IJ SOLN: 25.0000 ug | INTRAMUSCULAR | Status: DC | PRN

## 2013-03-17 MED ORDER — LACTATED RINGERS IV SOLN
INTRAVENOUS | Status: DC
  Administered 2013-03-17: 09:00:00 via INTRAVENOUS

## 2013-03-17 MED ORDER — NEOSTIGMINE METHYLSULFATE 1 MG/ML IJ SOLN
INTRAMUSCULAR | Status: AC
  Filled 2013-03-17: qty 1

## 2013-03-17 MED ORDER — ONDANSETRON HCL 4 MG/2ML IJ SOLN
INTRAMUSCULAR | Status: AC
  Filled 2013-03-17: qty 2

## 2013-03-17 MED ORDER — LACTATED RINGERS IV SOLN
INTRAVENOUS | Status: DC | PRN
  Administered 2013-03-17 (×2): via INTRAVENOUS

## 2013-03-17 MED ORDER — ROCURONIUM BROMIDE 100 MG/10ML IV SOLN
INTRAVENOUS | Status: DC | PRN
  Administered 2013-03-17: 50 mg via INTRAVENOUS

## 2013-03-17 MED ORDER — GLYCOPYRROLATE 0.2 MG/ML IJ SOLN
INTRAMUSCULAR | Status: DC | PRN
  Administered 2013-03-17: 0.6 mg via INTRAVENOUS

## 2013-03-17 MED ORDER — EPHEDRINE SULFATE 50 MG/ML IJ SOLN
INTRAMUSCULAR | Status: DC | PRN
  Administered 2013-03-17: 10 mg via INTRAVENOUS

## 2013-03-17 MED ORDER — KETOROLAC TROMETHAMINE 30 MG/ML IJ SOLN: 15.0000 mg | Freq: Once | INTRAMUSCULAR | Status: DC | PRN

## 2013-03-17 MED ORDER — MIDAZOLAM HCL 2 MG/2ML IJ SOLN
INTRAMUSCULAR | Status: AC
  Filled 2013-03-17: qty 2

## 2013-03-17 MED ORDER — KETOROLAC TROMETHAMINE 30 MG/ML IJ SOLN
INTRAMUSCULAR | Status: DC | PRN
  Administered 2013-03-17: 30 mg via INTRAVENOUS

## 2013-03-17 MED ORDER — ONDANSETRON HCL 4 MG/2ML IJ SOLN
INTRAMUSCULAR | Status: DC | PRN
  Administered 2013-03-17: 4 mg via INTRAVENOUS

## 2013-03-17 MED ORDER — LIDOCAINE HCL (CARDIAC) 20 MG/ML IV SOLN
INTRAVENOUS | Status: AC
  Filled 2013-03-17: qty 5

## 2013-03-17 MED ORDER — FENTANYL CITRATE 0.05 MG/ML IJ SOLN
INTRAMUSCULAR | Status: DC | PRN
  Administered 2013-03-17 (×4): 50 ug via INTRAVENOUS

## 2013-03-17 MED ORDER — BUPIVACAINE HCL (PF) 0.5 % IJ SOLN
INTRAMUSCULAR | Status: AC
  Filled 2013-03-17: qty 30

## 2013-03-17 MED ORDER — EPHEDRINE 5 MG/ML INJ
INTRAVENOUS | Status: AC
  Filled 2013-03-17: qty 10

## 2013-03-17 SURGICAL SUPPLY — 45 items
ADH SKN CLS LQ APL DERMABOND (GAUZE/BANDAGES/DRESSINGS) ×2
APPLICATOR COTTON TIP 6IN STRL (MISCELLANEOUS) ×3 IMPLANT
BAG SPEC RTRVL LRG 6X4 10 (ENDOMECHANICALS)
CABLE HIGH FREQUENCY MONO STRZ (ELECTRODE) IMPLANT
CATH ROBINSON RED A/P 16FR (CATHETERS) ×3 IMPLANT
CLOTH BEACON ORANGE TIMEOUT ST (SAFETY) ×3 IMPLANT
CONTAINER PREFILL 10% NBF 60ML (FORM) IMPLANT
DERMABOND ADHESIVE PROPEN (GAUZE/BANDAGES/DRESSINGS) ×1
DERMABOND ADVANCED .7 DNX6 (GAUZE/BANDAGES/DRESSINGS) IMPLANT
DILATOR CANAL MILEX (MISCELLANEOUS) IMPLANT
DRESSING TELFA 8X3 (GAUZE/BANDAGES/DRESSINGS) ×3 IMPLANT
DRSG COVADERM PLUS 2X2 (GAUZE/BANDAGES/DRESSINGS) ×1 IMPLANT
DURAPREP 26ML APPLICATOR (WOUND CARE) ×3 IMPLANT
ELECT REM PT RETURN 9FT ADLT (ELECTROSURGICAL)
ELECTRODE REM PT RTRN 9FT ADLT (ELECTROSURGICAL) IMPLANT
FORCEPS CUTTING 33CM 5MM (CUTTING FORCEPS) IMPLANT
FORCEPS CUTTING 45CM 5MM (CUTTING FORCEPS) IMPLANT
GLOVE BIO SURGEON STRL SZ 6.5 (GLOVE) ×3 IMPLANT
GOWN PREVENTION PLUS LG XLONG (DISPOSABLE) ×6 IMPLANT
GOWN STRL REIN XL XLG (GOWN DISPOSABLE) ×6 IMPLANT
NDL SAFETY ECLIPSE 18X1.5 (NEEDLE) ×2 IMPLANT
NDL SPNL 18GX3.5 QUINCKE PK (NEEDLE) ×2 IMPLANT
NEEDLE HYPO 18GX1.5 SHARP (NEEDLE) ×3
NEEDLE INSUFFLATION 120MM (ENDOMECHANICALS) ×3 IMPLANT
NEEDLE SPNL 18GX3.5 QUINCKE PK (NEEDLE) ×3 IMPLANT
NS IRRIG 1000ML POUR BTL (IV SOLUTION) ×3 IMPLANT
PACK LAPAROSCOPY BASIN (CUSTOM PROCEDURE TRAY) ×3 IMPLANT
PACK VAGINAL MINOR WOMEN LF (CUSTOM PROCEDURE TRAY) ×2 IMPLANT
PAD OB MATERNITY 4.3X12.25 (PERSONAL CARE ITEMS) ×3 IMPLANT
PAD PREP 24X48 CUFFED NSTRL (MISCELLANEOUS) ×3 IMPLANT
POUCH SPECIMEN RETRIEVAL 10MM (ENDOMECHANICALS) IMPLANT
PROTECTOR NERVE ULNAR (MISCELLANEOUS) ×3 IMPLANT
SCALPEL HARMONIC ACE (MISCELLANEOUS) IMPLANT
SET IRRIG TUBING LAPAROSCOPIC (IRRIGATION / IRRIGATOR) ×1 IMPLANT
STRIP CLOSURE SKIN 1/2X4 (GAUZE/BANDAGES/DRESSINGS) ×3 IMPLANT
SUT VICRYL 0 ENDOLOOP (SUTURE) IMPLANT
SUT VICRYL 0 UR6 27IN ABS (SUTURE) ×3 IMPLANT
SUT VICRYL 4-0 PS2 18IN ABS (SUTURE) ×6 IMPLANT
SYR CONTROL 10ML LL (SYRINGE) ×3 IMPLANT
TOWEL OR 17X24 6PK STRL BLUE (TOWEL DISPOSABLE) ×6 IMPLANT
TROCAR OPTI TIP 5M 100M (ENDOMECHANICALS) ×3 IMPLANT
TROCAR XCEL DIL TIP R 11M (ENDOMECHANICALS) ×3 IMPLANT
TROCAR XCEL NON-BLD 5MMX100MML (ENDOMECHANICALS) ×1 IMPLANT
WARMER LAPAROSCOPE (MISCELLANEOUS) ×3 IMPLANT
WATER STERILE IRR 1000ML POUR (IV SOLUTION) ×3 IMPLANT

## 2013-03-17 NOTE — H&P (Signed)
Whitney Contreras is an 37 y.o. MW female P2 (37 yo and 9 weeks). She is here today for 3 outpatient surgeries. She has retained POC seen on u/s which was done to evaluate her continued pp bleeding. She has had a right ovarian cyst of 10 cm for the last 2 years. She would like it removed. It causes her pelvic pain with coughing, sneezing, and sex. The third thing she would like accoplished today is permanent sterility in the form of a bilateral salpingectomy.   Pertinent Gynecological History: Menses: flow is excessive with use of irregular and excessive since delivery pads or tampons on heaviest days Bleeding: intermenstrual bleeding Contraception: abstinence DES exposure: denies Blood transfusions: none Sexually transmitted diseases: no past history Previous GYN Procedures: dilation and curettage after a miscarriage Last mammogram: n/a Date: n/a Last pap: normal Date: 2013 OB History: G4, P2   Menstrual History: Menarche age: 55 Patient's last menstrual period was 03/15/2013.    Past Medical History  Diagnosis Date  . 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 HSV-1 infection     Valtrex PRN  . Endometriosis   . Miscarriage     x2  . History of abnormal cervical Pap smear   . Anxiety   . Anemia     during pregnancy  . Abnormal Pap smear   . PONV (postoperative nausea and vomiting)     Past Surgical History  Procedure Laterality Date  . Foot surgery      buion remove  . Wisdom tooth extraction      x4  . Vaginal delivery  2009, 2014  . Dilation and curettage of uterus  2007  . Laparoscopy  2004    Family History  Problem Relation Age of Onset  . Adopted: Yes    Social History:  reports that she has been smoking Cigarettes.  She has a 7.5 pack-year smoking history. She has never used smokeless tobacco. She reports that  drinks alcohol. She reports that she does not use illicit drugs.  Allergies:  Allergies  Allergen Reactions  . Vicodin  [Hydrocodone-Acetaminophen] Nausea And Vomiting    lightheaded    Prescriptions prior to admission  Medication Sig Dispense Refill  . ALPRAZolam (XANAX) 0.5 MG tablet Take 0.25-0.5 mg by mouth at bedtime as needed for sleep.        ROS  Blood pressure 109/68, pulse 75, temperature 97.9 F (36.6 C), temperature source Oral, resp. rate 18, height 5\' 4"  (1.626 m), weight 68.04 kg (150 lb), last menstrual period 03/15/2013, SpO2 100.00%. Physical Exam  Results for orders placed during the hospital encounter of 03/17/13 (from the past 24 hour(s))  PREGNANCY, URINE     Status: None   Collection Time    03/17/13  8:15 AM      Result Value Range   Preg Test, Ur NEGATIVE  NEGATIVE  CBC     Status: None   Collection Time    03/17/13  8:20 AM      Result Value Range   WBC 8.8  4.0 - 10.5 K/uL   RBC 4.55  3.87 - 5.11 MIL/uL   Hemoglobin 13.6  12.0 - 15.0 g/dL   HCT 45.4  09.8 - 11.9 %   MCV 87.9  78.0 - 100.0 fL   MCH 29.9  26.0 - 34.0 pg   MCHC 34.0  30.0 - 36.0 g/dL   RDW 14.7  82.9 - 56.2 %   Platelets 256  150 - 400 K/uL    No results found.  Assessment/Plan:  Retained POC- d&c Ovarian cyst- removed cyst, ovary if no normal ovarian tissue seen Permanent sterility - remove both oviducts   She understands the risks of surgery, including, but not to infection, bleeding, DVTs, damage to bowel, bladder, ureters. She wishes to proceed.    Kory Rains C. 03/17/2013, 9:22 AM

## 2013-03-17 NOTE — Op Note (Signed)
03/17/2013  10:50 AM  PATIENT:  Whitney Contreras  37 y.o. female  PRE-OPERATIVE DIAGNOSIS:   Right ovarian cyst, retained products of conception, desire for permanent sterility  POST-OPERATIVE DIAGNOSIS:  same  PROCEDURE:  Procedure(s): LAPAROSCOPIC BILATERAL SALPINGECTOMY with Removal of right ovarian cyst and ovary (Right) DILATATION AND CURETTAGE (N/A)  SURGEON:  Surgeon(s) and Role:    * Allie Bossier, MD - Primary      PHYSICIAN ASSISTANT:   ASSISTANTS: Jaynie Collins, MD   ANESTHESIA:   general  EBL:  Total I/O In: -  Out: 75 [Urine:50; Blood:25]  BLOOD ADMINISTERED:none  DRAINS: none   LOCAL MEDICATIONS USED:  MARCAINE     SPECIMEN:  Source of Specimen:  uterine curettings, bilateral oviducts, right ovary  DISPOSITION OF SPECIMEN:  PATHOLOGY  COUNTS:  YES  TOURNIQUET:  * No tourniquets in log *  DICTATION: .Dragon Dictation  PLAN OF CARE: Discharge to home after PACU  PATIENT DISPOSITION:  PACU - hemodynamically stable.   Delay start of Pharmacological VTE agent (>24hrs) due to surgical blood loss or risk of bleeding: not applicable  The risks, benefits, and alternatives of surgery were explained, understood, accepted. She is certain that she wants permanent sterility. She understands that this is not reversible. She understands there is a small failure rate of this procedure. She also would like to have both oviducts removed her due newest recommendations to help prevent ovarian/peritoneal cancer. In the operating room she was placed in the dorsal lithotomy position, and general anesthesia was given without complication. Her abdomen and vagina were prepped and draped in the usual sterile fashionHer bladder was emptied with a Robinson catheter. A timeout procedure was done. A bimanual exam revealed a small anteverted and mobile uterus. A paracervical block was done with 10 mL of 0.5% marcaine. Her cervix was already dilated enough to accommodate a small curette.  A thorough curettage done done in all quadrants of the uterus as well as the fundus. A moderate amount of tissue was obtained. A Hulka manipulator was placed.  Gloves were changed, and attention was turned to the abdomen. Approximately the 5 mL of 0.5% Marcaine was injected into the umbilicus. A vertical incision was made at the site. A varies needle was placed intraperitoneally. Low-flow CO2 was used to insufflate the abdomen to approximately 3-1/2 L. Once a good pneumoperitoneum was established, a 10 mm Excel trocar was placed. Laparoscopy confirmed correct placement.a 5 mm port was placed in each lower quadrant under direct laparoscopic visualization after injecting 0.5% Marcaine in the incision sites. Her pelvis and upper abdomen appeared normal with the exception of a very large right ovarian cyst, filling the pelvis. No normal ovarian tissue was noted. The left ovary appeared normal. The infundibulopelvic ligaments and ureters were noted. A Harmonic scapel was used to hemostatically removed both oviducts and the right ovary.  I opened the ovarian cyst and clear mucous was noted. It was suctioned out and the pelvis was thoroughly irrigated. I switched to a 5 mm camera and removed the oviducts and right ovary through the umbilical port.  I removed the 5 mm ports and noted hemostasis.No defects were palpable. A subcuticular closure was done with 4-0 Vicryl suture at all incision sites. A Steri-Strip was placed across each incision. She was extubated and taken to the recovery room in stable condition. She tolerated the procedure well.

## 2013-03-17 NOTE — Anesthesia Preprocedure Evaluation (Signed)
Anesthesia Evaluation  Patient identified by MRN, date of birth, ID band Patient awake    Reviewed: Allergy & Precautions, H&P , Patient's Chart, lab work & pertinent test results, reviewed documented beta blocker date and time   History of Anesthesia Complications Negative for: history of anesthetic complications  Airway Mallampati: II TM Distance: >3 FB Neck ROM: full    Dental no notable dental hx.    Pulmonary neg pulmonary ROS,  breath sounds clear to auscultation  Pulmonary exam normal       Cardiovascular Exercise Tolerance: Good negative cardio ROS  Rhythm:regular Rate:Normal     Neuro/Psych PSYCHIATRIC DISORDERS Anxiety  Neuromuscular disease negative neurological ROS  negative psych ROS   GI/Hepatic negative GI ROS, Neg liver ROS,   Endo/Other  negative endocrine ROS  Renal/GU negative Renal ROS     Musculoskeletal   Abdominal   Peds  Hematology negative hematology ROS (+) anemia ,   Anesthesia Other Findings   Reproductive/Obstetrics negative OB ROS                           Anesthesia Physical Anesthesia Plan  ASA: II  Anesthesia Plan: General ETT   Post-op Pain Management:    Induction:   Airway Management Planned:   Additional Equipment:   Intra-op Plan:   Post-operative Plan:   Informed Consent: I have reviewed the patients History and Physical, chart, labs and discussed the procedure including the risks, benefits and alternatives for the proposed anesthesia with the patient or authorized representative who has indicated his/her understanding and acceptance.   Dental Advisory Given  Plan Discussed with: CRNA and Surgeon  Anesthesia Plan Comments:         Anesthesia Quick Evaluation

## 2013-03-17 NOTE — Anesthesia Postprocedure Evaluation (Signed)
Anesthesia Post Note  Patient: Whitney Contreras  Procedure(s) Performed: Procedure(s) (LRB): LAPAROSCOPIC BILATERAL SALPINGECTOMY with Removal of right ovarian cyst and ovary (Right) DILATATION AND CURETTAGE (N/A)  Anesthesia type: General  Patient location: PACU  Post pain: Pain level controlled  Post assessment: Post-op Vital signs reviewed  Last Vitals:  Filed Vitals:   03/17/13 1100  BP: 91/45  Pulse: 67  Temp:   Resp: 16    Post vital signs: Reviewed  Level of consciousness: sedated  Complications: No apparent anesthesia complications

## 2013-03-17 NOTE — Transfer of Care (Signed)
Immediate Anesthesia Transfer of Care Note  Patient: Whitney Contreras  Procedure(s) Performed: Procedure(s): LAPAROSCOPIC BILATERAL SALPINGECTOMY/With removal of right ovarian cyst, possible removal of right ovary (Right) DILATATION AND CURETTAGE (N/A)  Patient Location: PACU  Anesthesia Type:General  Level of Consciousness: awake, alert , oriented and patient cooperative  Airway & Oxygen Therapy: Patient Spontanous Breathing and Patient connected to nasal cannula oxygen  Post-op Assessment: Report given to PACU RN and Post -op Vital signs reviewed and stable  Post vital signs: Reviewed and stable  Complications: No apparent anesthesia complications

## 2013-03-17 NOTE — Preoperative (Signed)
Beta Blockers   Reason not to administer Beta Blockers:Not Applicable 

## 2013-03-18 ENCOUNTER — Encounter (HOSPITAL_COMMUNITY): Payer: Self-pay | Admitting: Obstetrics & Gynecology

## 2013-04-01 ENCOUNTER — Ambulatory Visit (INDEPENDENT_AMBULATORY_CARE_PROVIDER_SITE_OTHER): Payer: BC Managed Care – PPO | Admitting: Family Medicine

## 2013-04-01 ENCOUNTER — Encounter: Payer: Self-pay | Admitting: Family Medicine

## 2013-04-01 VITALS — BP 121/90 | HR 96 | Ht 64.0 in | Wt 159.0 lb

## 2013-04-01 DIAGNOSIS — Z09 Encounter for follow-up examination after completed treatment for conditions other than malignant neoplasm: Secondary | ICD-10-CM

## 2013-04-01 DIAGNOSIS — D369 Benign neoplasm, unspecified site: Secondary | ICD-10-CM

## 2013-04-01 DIAGNOSIS — D279 Benign neoplasm of unspecified ovary: Secondary | ICD-10-CM

## 2013-04-01 NOTE — Progress Notes (Signed)
  Subjective:    Patient ID: Whitney Contreras, female    DOB: [birth year], 37 y.o.   MRN: [id number]  HPI  S/p laparoscopic salpingectomy and oophorectomy 2 wks ago.  Bleeding at umbilicus. Pathology reviewed with pt.  Review of Systems  Constitutional: Negative for fever and chills.  Respiratory: Negative for shortness of breath.   Cardiovascular: Negative for chest pain.  Gastrointestinal: Negative for nausea, vomiting, diarrhea and constipation.       Objective:   Physical Exam  Vitals reviewed. Constitutional: She appears well-developed and well-nourished.  Pulmonary/Chest: Effort normal.  Abdominal: Soft. There is no tenderness.  Skin:  One half of umbilical incision is raised and weeping, no erythema.  Treated with AgNO3.          Assessment & Plan:

## 2013-04-01 NOTE — Patient Instructions (Signed)
Incision Care  An incision is when a surgeon cuts into your body tissues. After surgery, the incision needs to be cared for properly to prevent infection.   HOME CARE INSTRUCTIONS    Take all medicine as directed by your caregiver. Only take over-the-counter or prescription medicines for pain, discomfort, or fever as directed by your caregiver.   Do not remove your bandage (dressing) or get your incision wet until your surgeon gives you permission. In the event that your dressing becomes wet, dirty, or starts to smell, change the dressing and call your surgeon for instructions as soon as possible.   Take showers. Do not take tub baths, swim, or do anything that may soak the wound until it is healed.   Resume your normal diet and activities as directed or allowed.   Avoid lifting any weight until you are instructed otherwise.   Use anti-itch antihistamine medicine as directed by your caregiver. The wound may itch when it is healing. Do not pick or scratch at the wound.   Follow up with your caregiver for stitch (suture) or staple removal as directed.   Drink enough fluids to keep your urine clear or pale yellow.  SEEK MEDICAL CARE IF:    You have redness, swelling, or increasing pain in the wound that is not controlled with medicine.   You have drainage, blood, or pus coming from the wound that lasts longer than 1 day.   You develop muscle aches, chills, or a general ill feeling.   You notice a bad smell coming from the wound or dressing.   Your wound edges separate after the sutures, staples, or skin adhesive strips have been removed.   You develop persistent nausea or vomiting.  SEEK IMMEDIATE MEDICAL CARE IF:    You have a fever.   You develop a rash.   You develop dizzy episodes or faint while standing.   You have difficulty breathing.   You develop any reaction or side effects to medicine given.  MAKE SURE YOU:    Understand these instructions.   Will watch your condition.   Will get help  right away if you are not doing well or get worse.  Document Released: 12/29/2004 Document Revised: 09/03/2011 Document Reviewed: 10/15/2010  ExitCare Patient Information 2014 [employer]

## 2013-07-20 ENCOUNTER — Telehealth: Payer: Self-pay | Admitting: *Deleted

## 2013-07-20 MED ORDER — ALPRAZOLAM 0.5 MG PO TABS: 0.2500 mg | ORAL_TABLET | Freq: Every evening | ORAL | Status: DC | PRN

## 2013-07-20 NOTE — Telephone Encounter (Signed)
Called in medication to patients pharmacy per Dr. Kennon Rounds.  Patient was notified by phone.

## 2013-07-20 NOTE — Telephone Encounter (Signed)
Patient is requesting refill of her xanax .5 mg.

## 2013-07-20 NOTE — Addendum Note (Signed)
Addended by: Donnamae Jude on: 07/20/2013 01:47 PM   Modules accepted: Orders

## 2013-07-23 ENCOUNTER — Encounter: Payer: BC Managed Care – PPO | Admitting: Obstetrics & Gynecology

## 2014-04-26 ENCOUNTER — Encounter: Payer: Self-pay | Admitting: Family Medicine

## 2014-06-21 IMAGING — US US OB COMP LESS 14 WK
1 series · 13 of 28 positions shown · non-contrast
Comparison: none

[Series 1: us ob comp less 14 wk · 0.20mm/px · 13 of 40 slices shown]
[im 2/40]
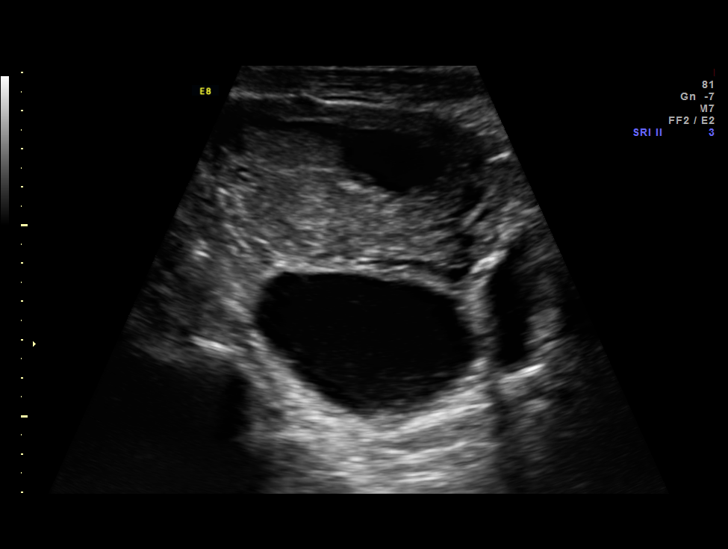
[im 5/40]
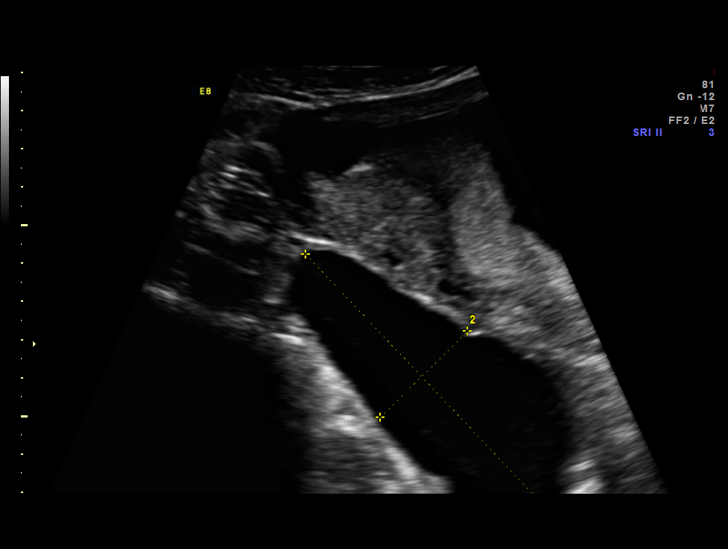
[im 8/40]
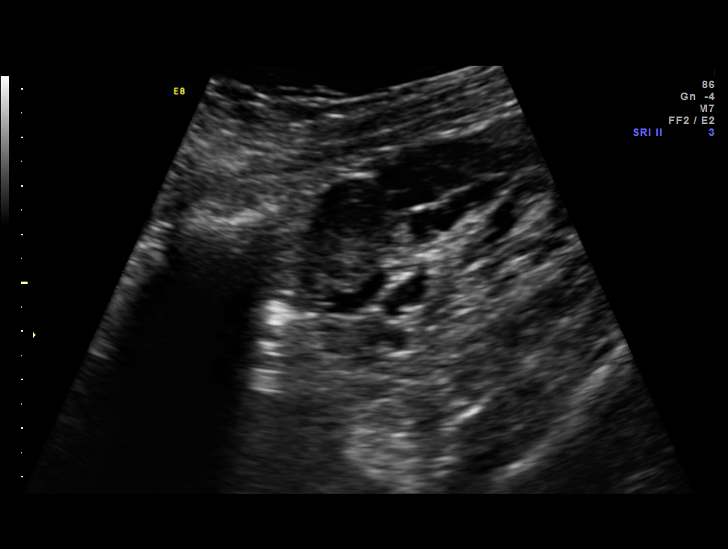
[im 11/40]
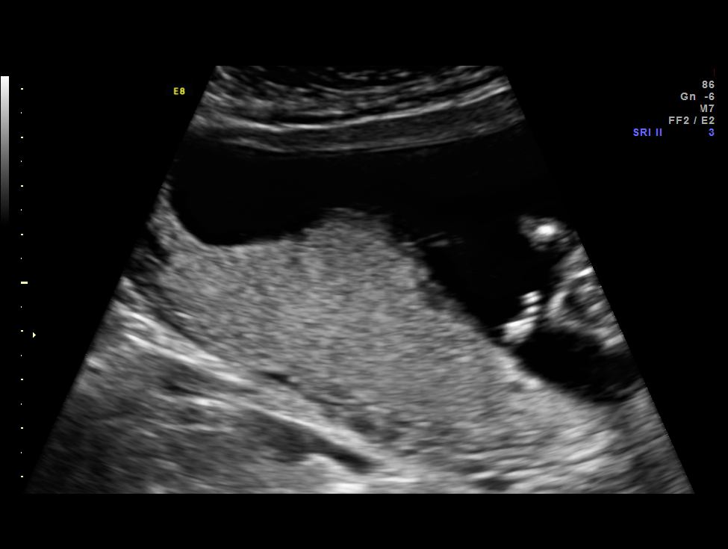
[im 14/40]
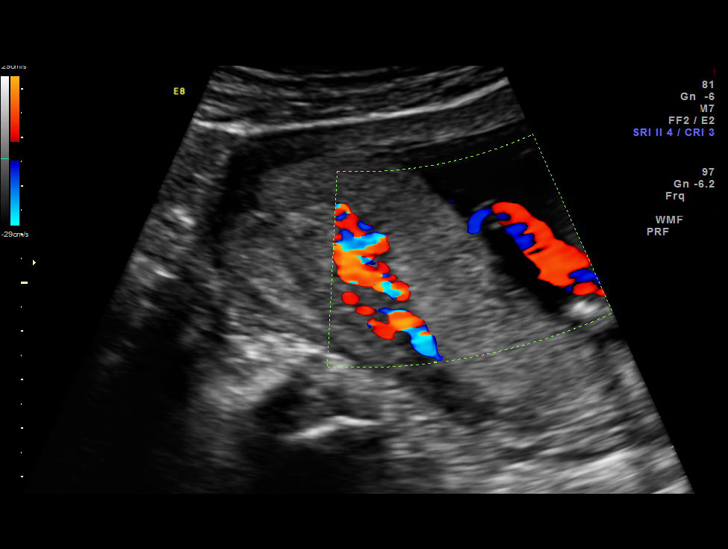
[im 16/40]
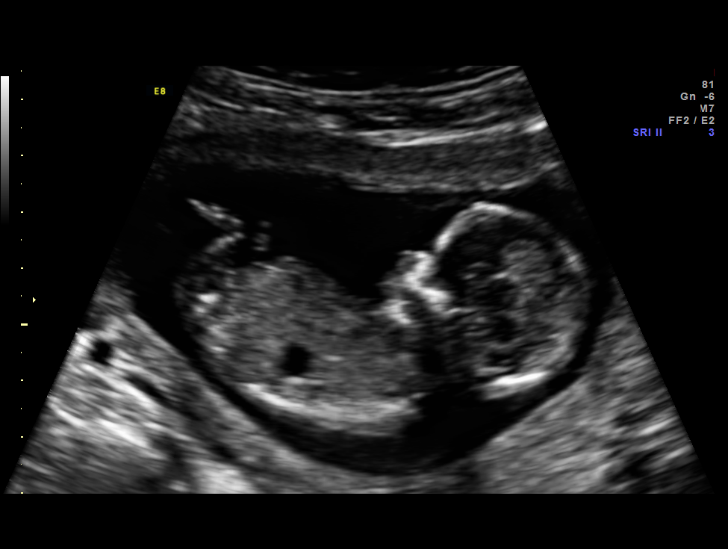
[im 21/40]
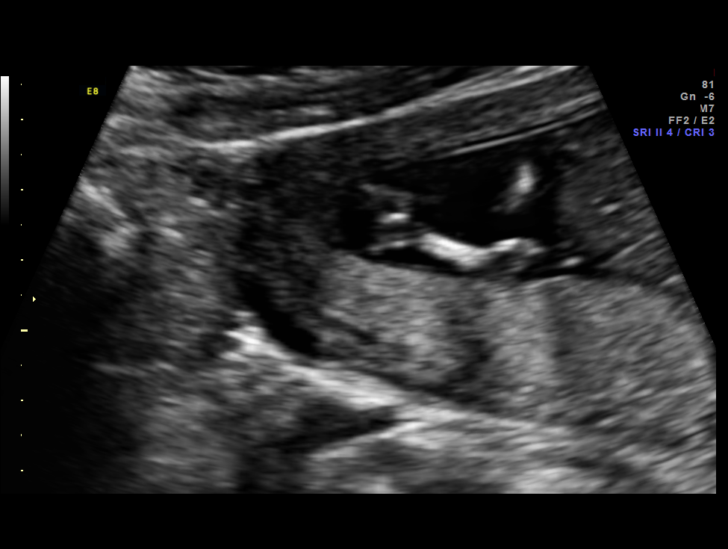
[im 24/40]
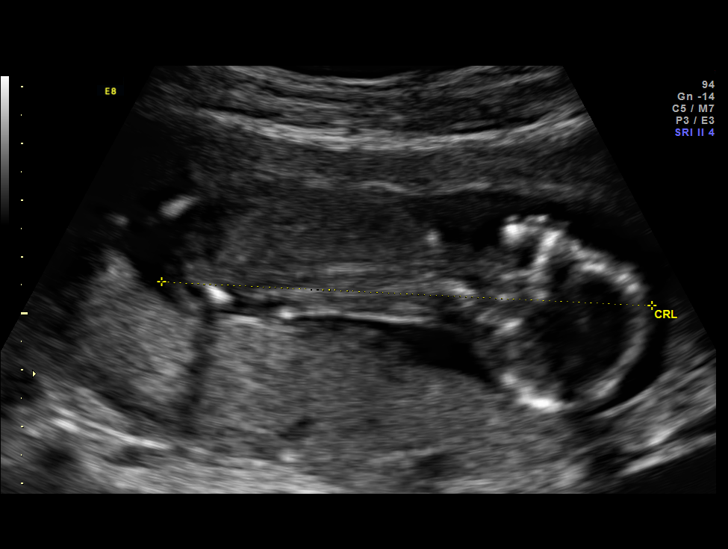
[im 27/40]
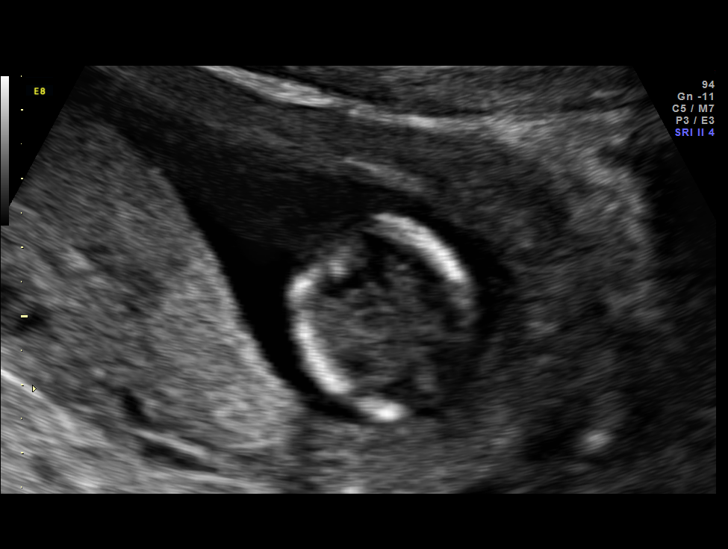
[im 29/40]
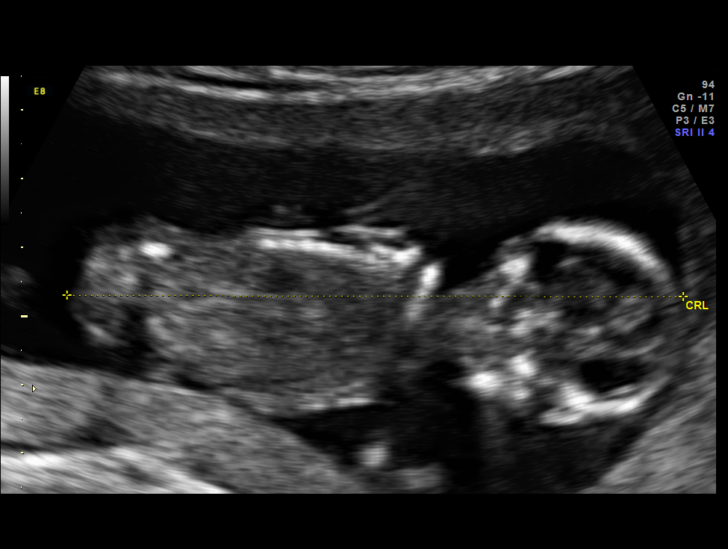
[im 32/40]
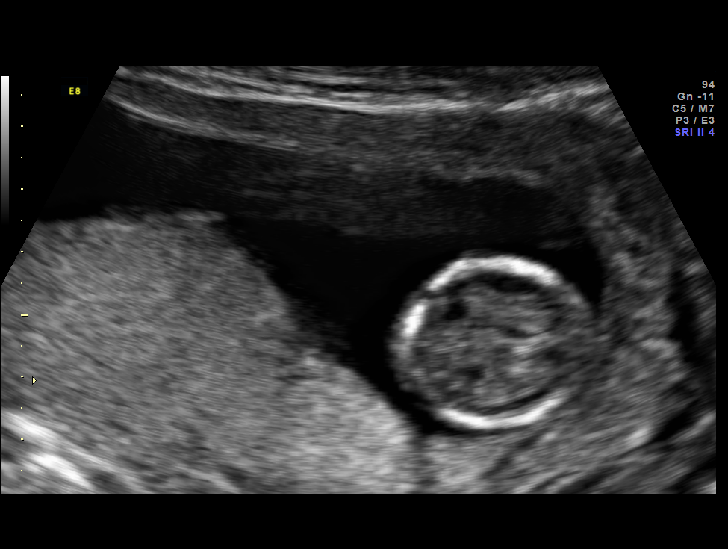
[im 35/40]
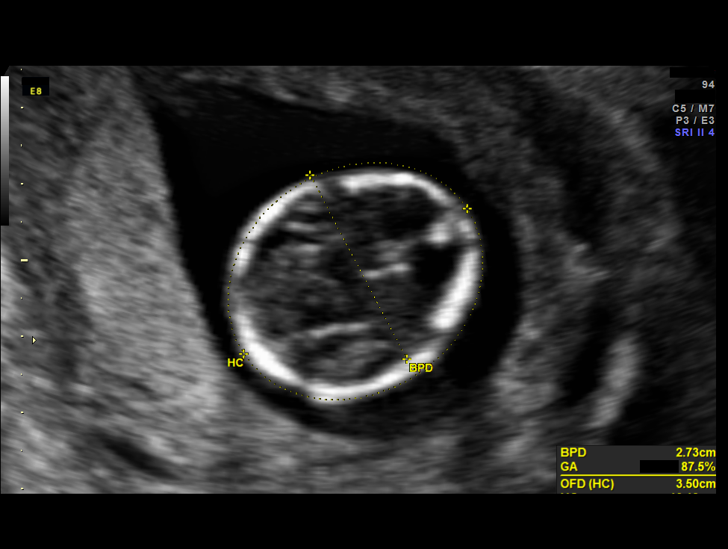
[im 38/40]
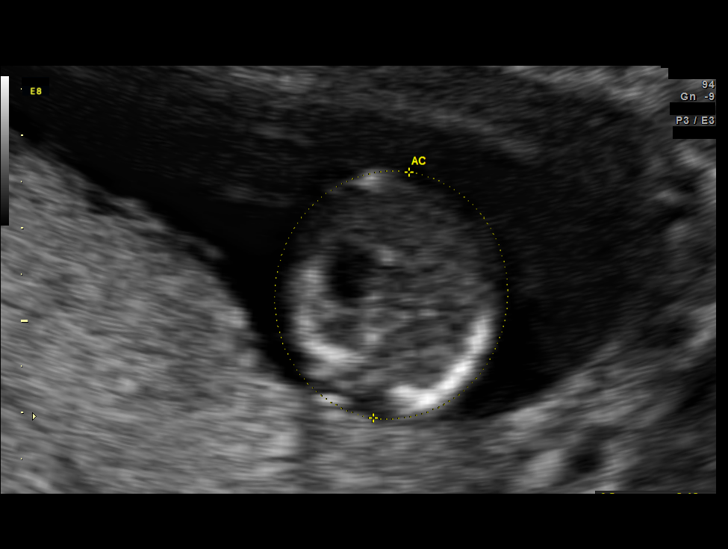

[13 of 28 positions shown; findings below may reference images not displayed]

OBSTETRICS REPORT
                      (Signed Final 07/02/2012 [DATE])

Service(s) Provided

 US OB COMP LESS 14 WKS                                76801.0
Indications

 First trimester aneuploidy screen (NT)
 Advanced maternal age (AMA), Multigravida
Fetal Evaluation

 Num Of Fetuses:    1
 Fetal Heart Rate:  147                          bpm
 Cardiac Activity:  Observed
 Presentation:      Variable
 Placenta:          Posterior, above cervical
                    os
 P. Cord            Visualized
 Insertion:

 Amniotic Fluid
 AFI FV:      Subjectively within normal limits
Biometry

 BPD:     27.4  mm     G. Age:  14w 6d                CI:         78.1   70 - 86
 OFD:     35.1  mm                                    FL/HC:
 HC:     100.9  mm     G. Age:  14w 5d                HC/AC:      1.27   1.14 -

 AC:      79.2  mm     G. Age:  14w 2d                FL/BPD:
 FL:      11.7  mm     G. Age:  13w 3d                FL/AC:      14.8   20 - 24
 HUM:     12.6  mm     G. Age:  13w 3d

 Est. FW:      87  gm      0 lb 3 oz
Gestational Age

 LMP:           13w 5d        Date:  03/28/12                 EDD:   01/02/13
 U/S Today:     14w 2d                                        EDD:   12/29/12
 Best:          13w 5d     Det. By:  LMP  (03/28/12)          EDD:   01/02/13
Anatomy

 Choroid Plexus:   Appears normal         Cord Vessels:     Appears normal (3
                                                            vessel cord)
 Stomach:          Appears normal, left   Bladder:          Appears normal
                   sided
 Abdomen:          Appears normal         Lower             Appears normal
                                          Extremities:
 Abdominal Wall:   Appears nml (cord      Upper             Appears normal
                   insert, abd wall)      Extremities:
Cervix Uterus Adnexa

 Cervix:       Normal appearance by transabdominal scan.

 Left Ovary:    Within normal limits.
 Right Ovary:   Simple cyst, measuring 9.1 x 3.2 x 5.3cm
Comments

 Ms. Sisco was scheduled for a nuchal translucency
 measurement as part of a first trimester screen.  However,
 the fetal CRL is too large for that test.  The patient's fetal
 anatomic survey is not complete due her early gestational
 age.  However, no gross fetal anomalies were identified.  Ms.
 Sharmatee Lemhard to purse cell-free fetal DNA testing for
 aneuploidy.  Blood was drawn for this today.
Impression

 Single living intrauterine pregnancy at 13 weeks 5 days.
 Appropriate fetal growth.
 Normal amniotic fluid volume.
 No gross fetal anomalies identified.
Recommendations

 Recommend follow-up ultrasound examination at 18 weeks
 for detail fetal anatomic survey.

 questions or concerns.
                [place], [place]

## 2014-08-17 IMAGING — US US ART/VEN ABD/PELV/SCROTUM DOPPLER LTD
1 series · 14 of 19 positions shown · non-contrast
Comparison: 08/25/2012

*RADIOLOGY REPORT* DOPPLER ULTRASOUND OF OVARIES  with LIMITED
ULTRASOUND OF PELVIS
CLINICAL DATA: Known right ovarian cyst in pregnancy.  Evaluate for
torsion
TECHNIQUE: Color and duplex Doppler ultrasound was utilized to
evaluate blood flow to the ovaries.

[Series 1: us pelvis limited · 14 of 19 slices shown]
[im 1/19]
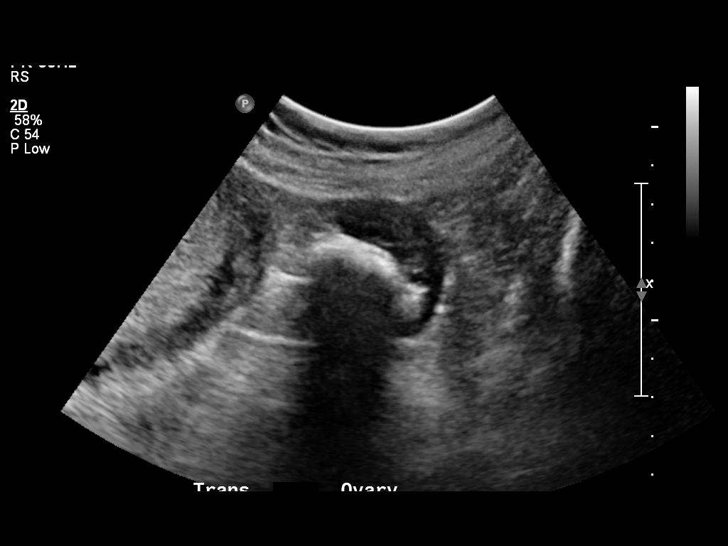
[im 3/19]
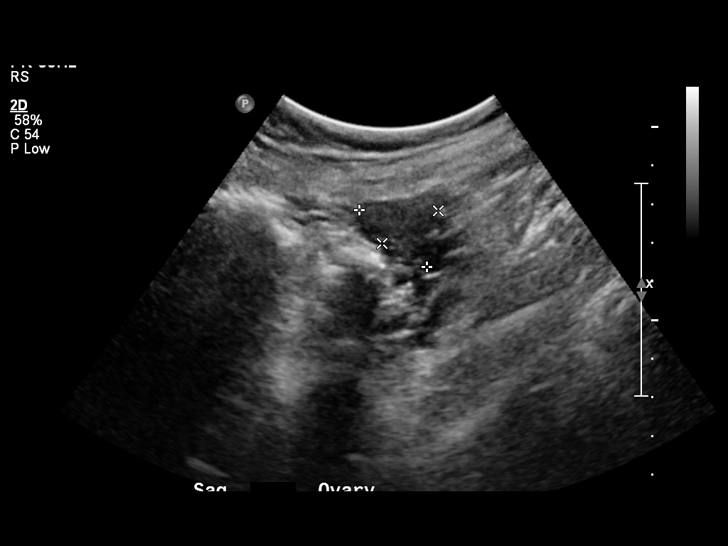
[im 4/19]
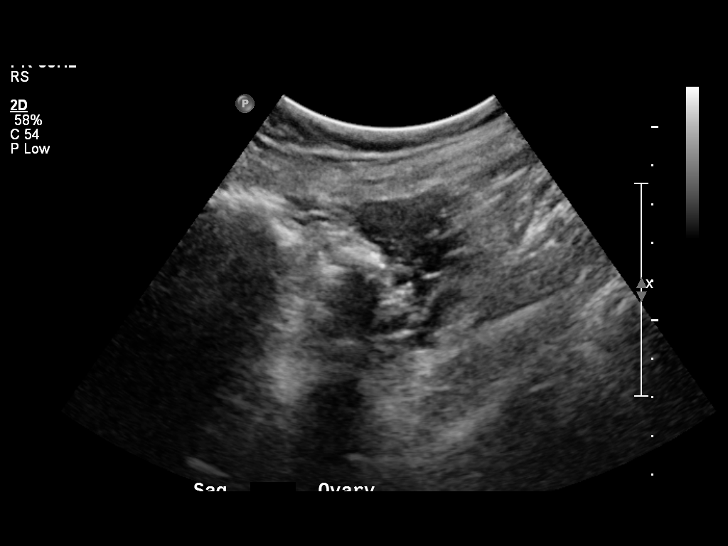
[im 5/19]
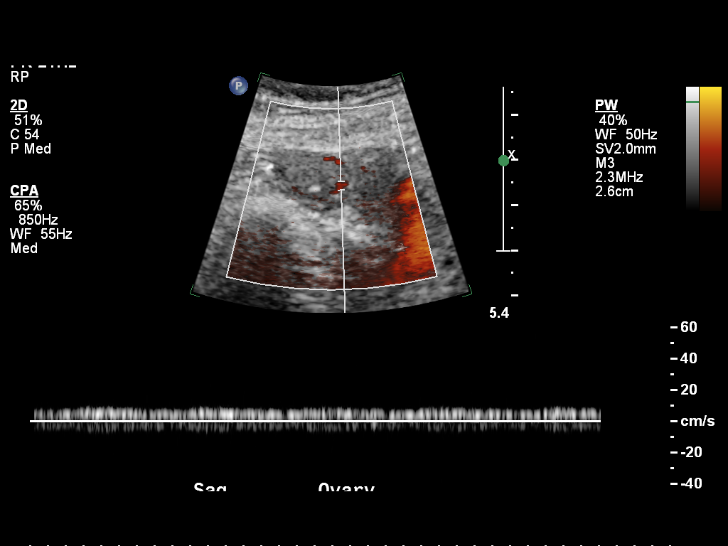
[im 7/19]
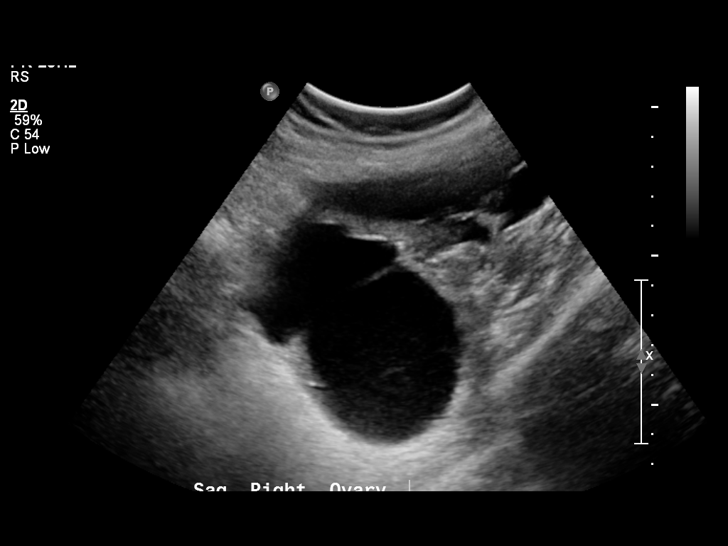
[im 8/19]
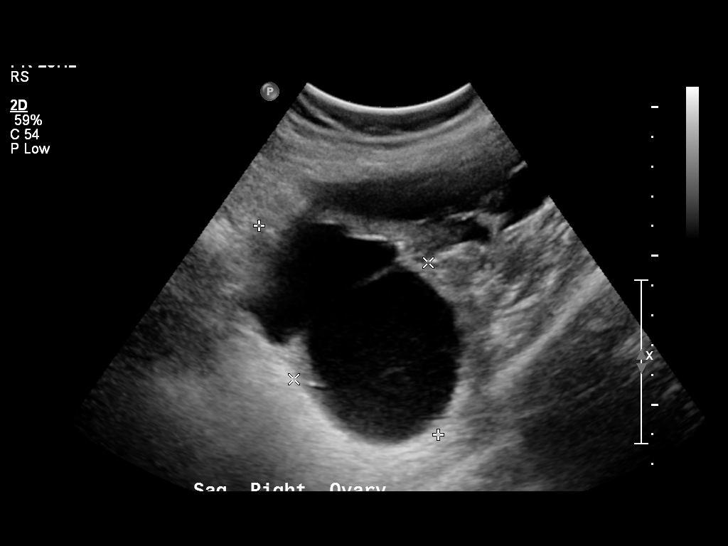
[im 9/19]
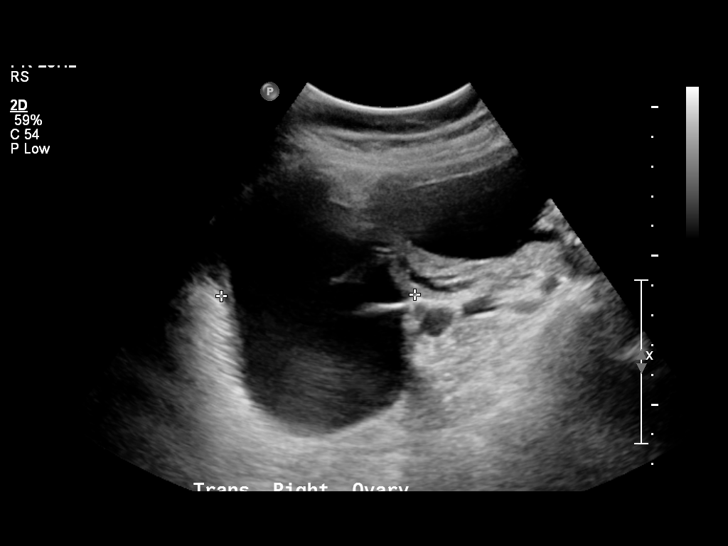
[im 11/19]
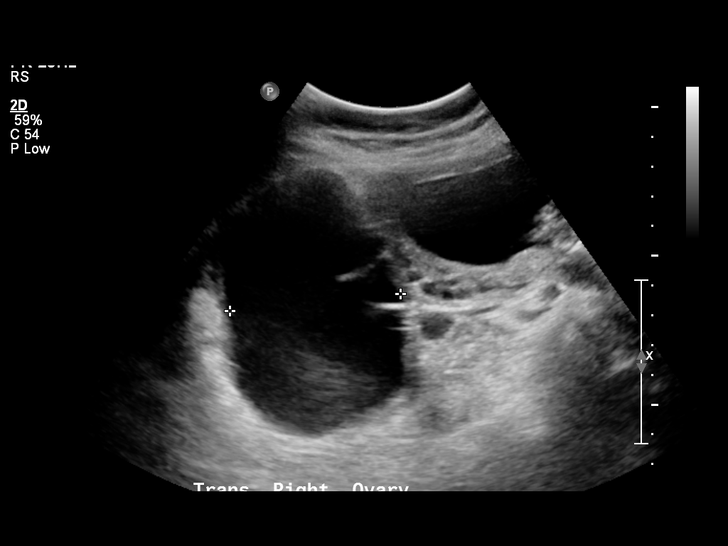
[im 12/19]
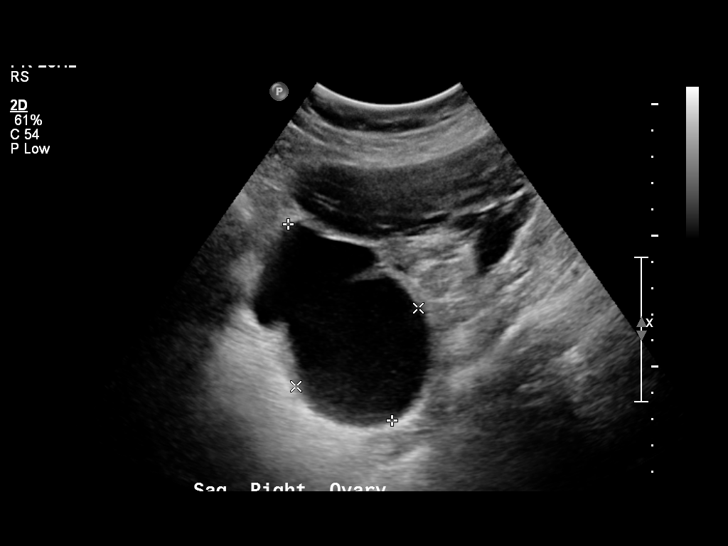
[im 13/19]
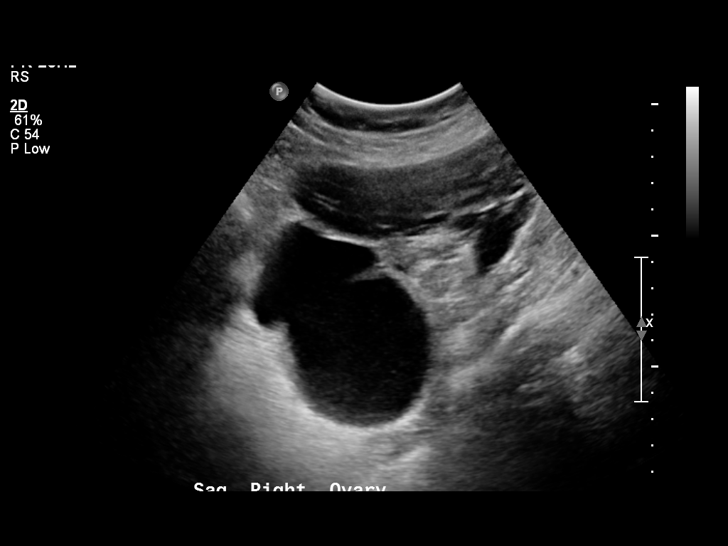
[im 15/19]
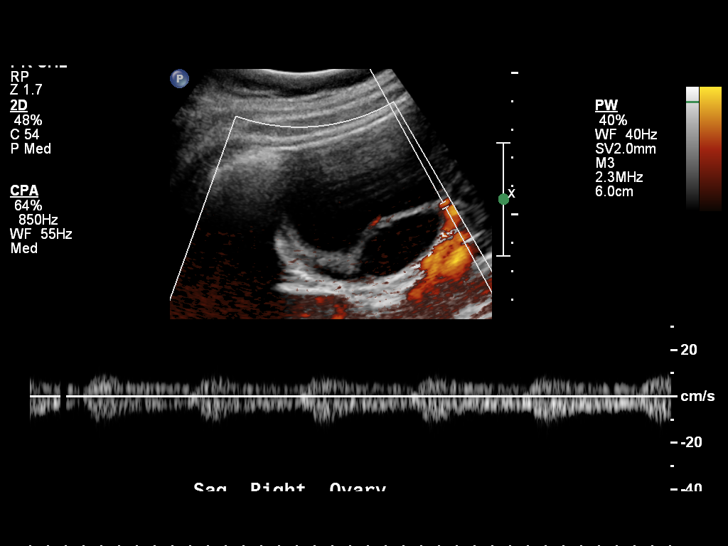
[im 16/19]
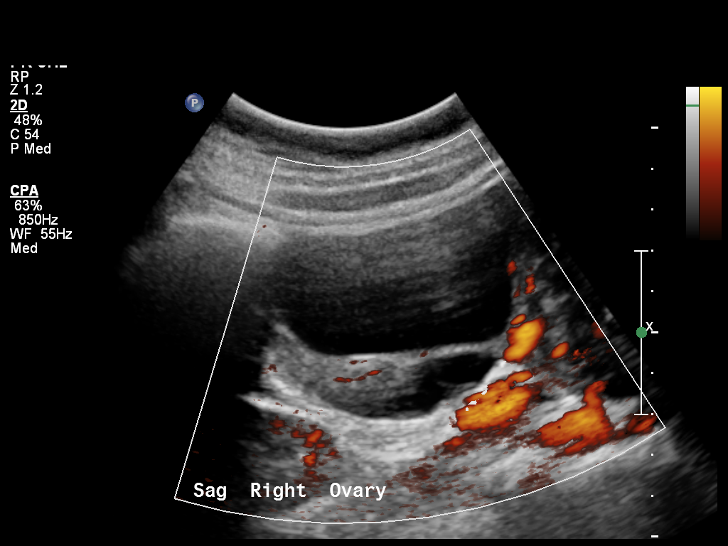
[im 17/19]
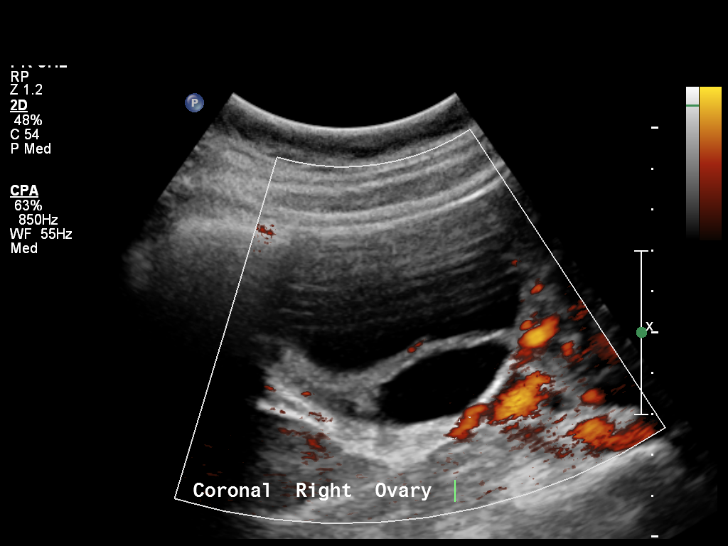
[im 19/19]
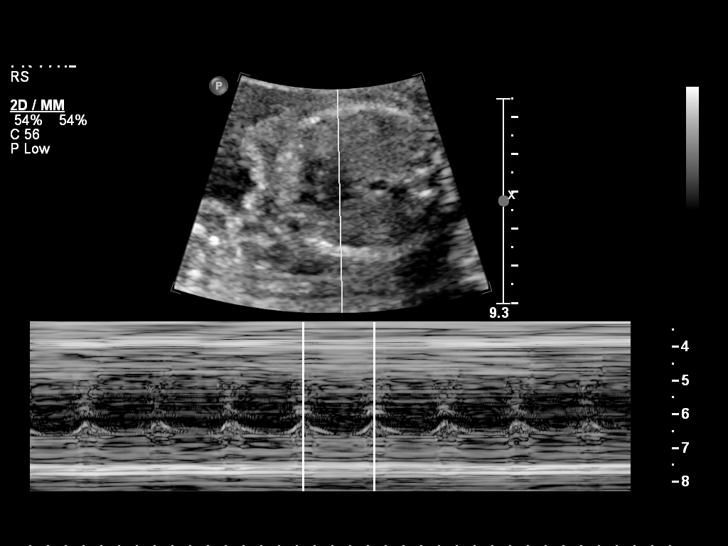

[14 of 19 positions shown; findings below may reference images not displayed]

FINDINGS: The right ovary was best seen transabdominally measuring
9.2 x 6.0 x 6.5 cm and containing a mildly complex cyst measuring
8.4 x 5.5 centimeters.  The appearance is unchanged in comparison
with the recent exam performed on 08/25/2012.  Color Doppler
evaluation reveals intra stromal flow and duplex exam demonstrates
both arterial and venous flow.

The left ovary measures 2.3 x 1.7 x 2.7 cm and has a normal
appearance.  Color Doppler demonstrates intra stromal flow and both
arterial and venous flow was seen with duplex exam.

No pelvic fluid is noted
IMPRESSION: Stable appearance to a complex right adnexal cyst with no
sonographic features suspicious for torsion.

## 2014-11-08 ENCOUNTER — Other Ambulatory Visit: Payer: Self-pay | Admitting: Family Medicine

## 2014-11-08 ENCOUNTER — Telehealth: Payer: Self-pay | Admitting: Family Medicine

## 2014-11-08 MED ORDER — ALPRAZOLAM 0.5 MG PO TABS: 0.2500 mg | ORAL_TABLET | Freq: Every evening | ORAL | Status: DC | PRN

## 2014-11-08 NOTE — Telephone Encounter (Signed)
Patient made follow up appt in May// wants refill xanax

## 2014-11-10 NOTE — Progress Notes (Signed)
Called in Xanax refills per Dr. Kennon Rounds.

## 2014-11-18 ENCOUNTER — Ambulatory Visit: Payer: BC Managed Care – PPO | Admitting: Obstetrics & Gynecology

## 2015-06-07 ENCOUNTER — Telehealth: Payer: Self-pay | Admitting: *Deleted

## 2015-06-07 DIAGNOSIS — G4700 Insomnia, unspecified: Secondary | ICD-10-CM

## 2015-06-07 MED ORDER — ALPRAZOLAM 0.5 MG PO TABS: 0.2500 mg | ORAL_TABLET | Freq: Every evening | ORAL | Status: DC | PRN

## 2015-06-07 NOTE — Telephone Encounter (Signed)
Called refill to pharmacy, left message that rx was called to pharmacy and that she would need to call and schedule an appointment with Dr Kennon Rounds for any further refills.

## 2015-06-07 NOTE — Telephone Encounter (Signed)
-----   Message from Donnamae Jude, MD sent at 06/07/2015  9:30 AM EST ----- Regarding: RE: Rx Request Contact: [phone] You may call in x 1 # [phone] and no further refills--she needs an appointment, and may just need a PCP?  ----- Message -----    From: Ricka Burdock, RN    Sent: 06/07/2015   9:17 AM      To: Donnamae Jude, MD Subject: Melton Alar: Rx Request                                 Pt last rx was in May 2016 with 2 refills, please advise. Thanks.  ----- Message -----    From: Francia Greaves    Sent: 06/07/2015   8:47 AM      To: Ricka Burdock, RN Subject: Rx Request                                     Requesting a Refill on Xanax, has not been here since 2014, advised her to make an appt. Uses [facility] in [place]

## 2015-06-27 ENCOUNTER — Encounter: Payer: Self-pay | Admitting: Family Medicine

## 2015-06-27 ENCOUNTER — Ambulatory Visit (INDEPENDENT_AMBULATORY_CARE_PROVIDER_SITE_OTHER): Payer: BLUE CROSS/BLUE SHIELD | Admitting: Family Medicine

## 2015-06-27 VITALS — BP 109/72 | HR 88 | Resp 18 | Ht 64.0 in | Wt 146.0 lb

## 2015-06-27 DIAGNOSIS — Z1151 Encounter for screening for human papillomavirus (HPV): Secondary | ICD-10-CM | POA: Diagnosis not present

## 2015-06-27 DIAGNOSIS — Z01419 Encounter for gynecological examination (general) (routine) without abnormal findings: Secondary | ICD-10-CM | POA: Diagnosis not present

## 2015-06-27 DIAGNOSIS — Z124 Encounter for screening for malignant neoplasm of cervix: Secondary | ICD-10-CM

## 2015-06-27 NOTE — Patient Instructions (Signed)
Preventive Care for Adults, Female A healthy lifestyle and preventive care can promote health and wellness. Preventive health guidelines for women include the following key practices.  A routine yearly physical is a good way to check with your health care provider about your health and preventive screening. It is a chance to share any concerns and updates on your health and to receive a thorough exam.  Visit your dentist for a routine exam and preventive care every 6 months. Brush your teeth twice a day and floss once a day. Good oral hygiene prevents tooth decay and gum disease.  The frequency of eye exams is based on your age, health, family medical history, use of contact lenses, and other factors. Follow your health care provider's recommendations for frequency of eye exams.  Eat a healthy diet. Foods like vegetables, fruits, whole grains, low-fat dairy products, and lean protein foods contain the nutrients you need without too many calories. Decrease your intake of foods high in solid fats, added sugars, and salt. Eat the right amount of calories for you.Get information about a proper diet from your health care provider, if necessary.  Regular physical exercise is one of the most important things you can do for your health. Most adults should get at least 150 minutes of moderate-intensity exercise (any activity that increases your heart rate and causes you to sweat) each week. In addition, most adults need muscle-strengthening exercises on 2 or more days a week.  Maintain a healthy weight. The body mass index (BMI) is a screening tool to identify possible weight problems. It provides an estimate of body fat based on height and weight. Your health care provider can find your BMI and can help you achieve or maintain a healthy weight.For adults 20 years and older:  A BMI below 18.5 is considered underweight.  A BMI of 18.5 to 24.9 is normal.  A BMI of 25 to 29.9 is considered overweight.  A  BMI of 30 and above is considered obese.  Maintain normal blood lipids and cholesterol levels by exercising and minimizing your intake of saturated fat. Eat a balanced diet with plenty of fruit and vegetables. Blood tests for lipids and cholesterol should begin at age 45 and be repeated every 5 years. If your lipid or cholesterol levels are high, you are over 50, or you are at high risk for heart disease, you may need your cholesterol levels checked more frequently.Ongoing high lipid and cholesterol levels should be treated with medicines if diet and exercise are not working.  If you smoke, find out from your health care provider how to quit. If you do not use tobacco, do not start.  Lung cancer screening is recommended for adults aged 45-80 years who are at high risk for developing lung cancer because of a history of smoking. A yearly low-dose CT scan of the lungs is recommended for people who have at least a 30-pack-year history of smoking and are a current smoker or have quit within the past 15 years. A pack year of smoking is smoking an average of 1 pack of cigarettes a day for 1 year (for example: 1 pack a day for 30 years or 2 packs a day for 15 years). Yearly screening should continue until the smoker has stopped smoking for at least 15 years. Yearly screening should be stopped for people who develop a health problem that would prevent them from having lung cancer treatment.  If you are pregnant, do not drink alcohol. If you are  breastfeeding, be very cautious about drinking alcohol. If you are not pregnant and choose to drink alcohol, do not have more than 1 drink per day. One drink is considered to be 12 ounces (355 mL) of beer, 5 ounces (148 mL) of wine, or 1.5 ounces (44 mL) of liquor.  Avoid use of street drugs. Do not share needles with anyone. Ask for help if you need support or instructions about stopping the use of drugs.  High blood pressure causes heart disease and increases the risk  of stroke. Your blood pressure should be checked at least every 1 to 2 years. Ongoing high blood pressure should be treated with medicines if weight loss and exercise do not work.  If you are 55-79 years old, ask your health care provider if you should take aspirin to prevent strokes.  Diabetes screening is done by taking a blood sample to check your blood glucose level after you have not eaten for a certain period of time (fasting). If you are not overweight and you do not have risk factors for diabetes, you should be screened once every 3 years starting at age 45. If you are overweight or obese and you are 40-70 years of age, you should be screened for diabetes every year as part of your cardiovascular risk assessment.  Breast cancer screening is essential preventive care for women. You should practice "breast self-awareness." This means understanding the normal appearance and feel of your breasts and may include breast self-examination. Any changes detected, no matter how small, should be reported to a health care provider. Women in their 20s and 30s should have a clinical breast exam (CBE) by a health care provider as part of a regular health exam every 1 to 3 years. After age 40, women should have a CBE every year. Starting at age 40, women should consider having a mammogram (breast X-ray test) every year. Women who have a family history of breast cancer should talk to their health care provider about genetic screening. Women at a high risk of breast cancer should talk to their health care providers about having an MRI and a mammogram every year.  Breast cancer gene (BRCA)-related cancer risk assessment is recommended for women who have family members with BRCA-related cancers. BRCA-related cancers include breast, ovarian, tubal, and peritoneal cancers. Having family members with these cancers may be associated with an increased risk for harmful changes (mutations) in the breast cancer genes BRCA1 and  BRCA2. Results of the assessment will determine the need for genetic counseling and BRCA1 and BRCA2 testing.  Your health care provider may recommend that you be screened regularly for cancer of the pelvic organs (ovaries, uterus, and vagina). This screening involves a pelvic examination, including checking for microscopic changes to the surface of your cervix (Pap test). You may be encouraged to have this screening done every 3 years, beginning at age 21.  For women ages 30-65, health care providers may recommend pelvic exams and Pap testing every 3 years, or they may recommend the Pap and pelvic exam, combined with testing for human papilloma virus (HPV), every 5 years. Some types of HPV increase your risk of cervical cancer. Testing for HPV may also be done on women of any age with unclear Pap test results.  Other health care providers may not recommend any screening for nonpregnant women who are considered low risk for pelvic cancer and who do not have symptoms. Ask your health care provider if a screening pelvic exam is right for   you.  If you have had past treatment for cervical cancer or a condition that could lead to cancer, you need Pap tests and screening for cancer for at least 20 years after your treatment. If Pap tests have been discontinued, your risk factors (such as having a new sexual partner) need to be reassessed to determine if screening should resume. Some women have medical problems that increase the chance of getting cervical cancer. In these cases, your health care provider may recommend more frequent screening and Pap tests.  Colorectal cancer can be detected and often prevented. Most routine colorectal cancer screening begins at the age of 50 years and continues through age 75 years. However, your health care provider may recommend screening at an earlier age if you have risk factors for colon cancer. On a yearly basis, your health care provider may provide home test kits to check  for hidden blood in the stool. Use of a small camera at the end of a tube, to directly examine the colon (sigmoidoscopy or colonoscopy), can detect the earliest forms of colorectal cancer. Talk to your health care provider about this at age 50, when routine screening begins. Direct exam of the colon should be repeated every 5-10 years through age 75 years, unless early forms of precancerous polyps or small growths are found.  People who are at an increased risk for hepatitis B should be screened for this virus. You are considered at high risk for hepatitis B if:  You were born in a country where hepatitis B occurs often. Talk with your health care provider about which countries are considered high risk.  Your parents were born in a high-risk country and you have not received a shot to protect against hepatitis B (hepatitis B vaccine).  You have HIV or AIDS.  You use needles to inject street drugs.  You live with, or have sex with, someone who has hepatitis B.  You get hemodialysis treatment.  You take certain medicines for conditions like cancer, organ transplantation, and autoimmune conditions.  Hepatitis C blood testing is recommended for all people born from 1945 through 1965 and any individual with known risks for hepatitis C.  Practice safe sex. Use condoms and avoid high-risk sexual practices to reduce the spread of sexually transmitted infections (STIs). STIs include gonorrhea, chlamydia, syphilis, trichomonas, herpes, HPV, and human immunodeficiency virus (HIV). Herpes, HIV, and HPV are viral illnesses that have no cure. They can result in disability, cancer, and death.  You should be screened for sexually transmitted illnesses (STIs) including gonorrhea and chlamydia if:  You are sexually active and are younger than 24 years.  You are older than 24 years and your health care provider tells you that you are at risk for this type of infection.  Your sexual activity has changed  since you were last screened and you are at an increased risk for chlamydia or gonorrhea. Ask your health care provider if you are at risk.  If you are at risk of being infected with HIV, it is recommended that you take a prescription medicine daily to prevent HIV infection. This is called preexposure prophylaxis (PrEP). You are considered at risk if:  You are sexually active and do not regularly use condoms or know the HIV status of your partner(s).  You take drugs by injection.  You are sexually active with a partner who has HIV.  Talk with your health care provider about whether you are at high risk of being infected with HIV. If   you choose to begin PrEP, you should first be tested for HIV. You should then be tested every 3 months for as long as you are taking PrEP.  Osteoporosis is a disease in which the bones lose minerals and strength with aging. This can result in serious bone fractures or breaks. The risk of osteoporosis can be identified using a bone density scan. Women ages 67 years and over and women at risk for fractures or osteoporosis should discuss screening with their health care providers. Ask your health care provider whether you should take a calcium supplement or vitamin D to reduce the rate of osteoporosis.  Menopause can be associated with physical symptoms and risks. Hormone replacement therapy is available to decrease symptoms and risks. You should talk to your health care provider about whether hormone replacement therapy is right for you.  Use sunscreen. Apply sunscreen liberally and repeatedly throughout the day. You should seek shade when your shadow is shorter than you. Protect yourself by wearing long sleeves, pants, a wide-brimmed hat, and sunglasses year round, whenever you are outdoors.  Once a month, do a whole body skin exam, using a mirror to look at the skin on your back. Tell your health care provider of new moles, moles that have irregular borders, moles that  are larger than a pencil eraser, or moles that have changed in shape or color.  Stay current with required vaccines (immunizations).  Influenza vaccine. All adults should be immunized every year.  Tetanus, diphtheria, and acellular pertussis (Td, Tdap) vaccine. Pregnant women should receive 1 dose of Tdap vaccine during each pregnancy. The dose should be obtained regardless of the length of time since the last dose. Immunization is preferred during the 27th-36th week of gestation. An adult who has not previously received Tdap or who does not know her vaccine status should receive 1 dose of Tdap. This initial dose should be followed by tetanus and diphtheria toxoids (Td) booster doses every 10 years. Adults with an unknown or incomplete history of completing a 3-dose immunization series with Td-containing vaccines should begin or complete a primary immunization series including a Tdap dose. Adults should receive a Td booster every 10 years.  Varicella vaccine. An adult without evidence of immunity to varicella should receive 2 doses or a second dose if she has previously received 1 dose. Pregnant females who do not have evidence of immunity should receive the first dose after pregnancy. This first dose should be obtained before leaving the health care facility. The second dose should be obtained 4-8 weeks after the first dose.  Human papillomavirus (HPV) vaccine. Females aged 13-26 years who have not received the vaccine previously should obtain the 3-dose series. The vaccine is not recommended for use in pregnant females. However, pregnancy testing is not needed before receiving a dose. If a female is found to be pregnant after receiving a dose, no treatment is needed. In that case, the remaining doses should be delayed until after the pregnancy. Immunization is recommended for any person with an immunocompromised condition through the age of 61 years if she did not get any or all doses earlier. During the  3-dose series, the second dose should be obtained 4-8 weeks after the first dose. The third dose should be obtained 24 weeks after the first dose and 16 weeks after the second dose.  Zoster vaccine. One dose is recommended for adults aged 30 years or older unless certain conditions are present.  Measles, mumps, and rubella (MMR) vaccine. Adults born  before 1957 generally are considered immune to measles and mumps. Adults born in 1957 or later should have 1 or more doses of MMR vaccine unless there is a contraindication to the vaccine or there is laboratory evidence of immunity to each of the three diseases. A routine second dose of MMR vaccine should be obtained at least 28 days after the first dose for students attending postsecondary schools, health care workers, or international travelers. People who received inactivated measles vaccine or an unknown type of measles vaccine during 1963-1967 should receive 2 doses of MMR vaccine. People who received inactivated mumps vaccine or an unknown type of mumps vaccine before 1979 and are at high risk for mumps infection should consider immunization with 2 doses of MMR vaccine. For females of childbearing age, rubella immunity should be determined. If there is no evidence of immunity, females who are not pregnant should be vaccinated. If there is no evidence of immunity, females who are pregnant should delay immunization until after pregnancy. Unvaccinated health care workers born before 1957 who lack laboratory evidence of measles, mumps, or rubella immunity or laboratory confirmation of disease should consider measles and mumps immunization with 2 doses of MMR vaccine or rubella immunization with 1 dose of MMR vaccine.  Pneumococcal 13-valent conjugate (PCV13) vaccine. When indicated, a person who is uncertain of his immunization history and has no record of immunization should receive the PCV13 vaccine. All adults 65 years of age and older should receive this  vaccine. An adult aged 19 years or older who has certain medical conditions and has not been previously immunized should receive 1 dose of PCV13 vaccine. This PCV13 should be followed with a dose of pneumococcal polysaccharide (PPSV23) vaccine. Adults who are at high risk for pneumococcal disease should obtain the PPSV23 vaccine at least 8 weeks after the dose of PCV13 vaccine. Adults older than 41 years of age who have normal immune system function should obtain the PPSV23 vaccine dose at least 1 year after the dose of PCV13 vaccine.  Pneumococcal polysaccharide (PPSV23) vaccine. When PCV13 is also indicated, PCV13 should be obtained first. All adults aged 65 years and older should be immunized. An adult younger than age 65 years who has certain medical conditions should be immunized. Any person who resides in a nursing home or long-term care facility should be immunized. An adult smoker should be immunized. People with an immunocompromised condition and certain other conditions should receive both PCV13 and PPSV23 vaccines. People with human immunodeficiency virus (HIV) infection should be immunized as soon as possible after diagnosis. Immunization during chemotherapy or radiation therapy should be avoided. Routine use of PPSV23 vaccine is not recommended for American Indians, [place] Natives, or people younger than 65 years unless there are medical conditions that require PPSV23 vaccine. When indicated, people who have unknown immunization and have no record of immunization should receive PPSV23 vaccine. One-time revaccination 5 years after the first dose of PPSV23 is recommended for people aged 19-64 years who have chronic kidney failure, nephrotic syndrome, asplenia, or immunocompromised conditions. People who received 1-2 doses of PPSV23 before age 65 years should receive another dose of PPSV23 vaccine at age 65 years or later if at least 5 years have passed since the previous dose. Doses of PPSV23 are not  needed for people immunized with PPSV23 at or after age 65 years.  Meningococcal vaccine. Adults with asplenia or persistent complement component deficiencies should receive 2 doses of quadrivalent meningococcal conjugate (MenACWY-D) vaccine. The doses should be obtained   at least 2 months apart. Microbiologists working with certain meningococcal bacteria, [place] recruits, people at risk during an outbreak, and people who travel to or live in countries with a high rate of meningitis should be immunized. A first-year college student up through age 34 years who is living in a residence hall should receive a dose if she did not receive a dose on or after her 16th birthday. Adults who have certain high-risk conditions should receive one or more doses of vaccine.  Hepatitis A vaccine. Adults who wish to be protected from this disease, have certain high-risk conditions, work with hepatitis A-infected animals, work in hepatitis A research labs, or travel to or work in countries with a high rate of hepatitis A should be immunized. Adults who were previously unvaccinated and who anticipate close contact with an international adoptee during the first 60 days after arrival in the [place] from a country with a high rate of hepatitis A should be immunized.  Hepatitis B vaccine. Adults who wish to be protected from this disease, have certain high-risk conditions, may be exposed to blood or other infectious body fluids, are household contacts or sex partners of hepatitis B positive people, are clients or workers in certain care facilities, or travel to or work in countries with a high rate of hepatitis B should be immunized.  Haemophilus influenzae type b (Hib) vaccine. A previously unvaccinated person with asplenia or sickle cell disease or having a scheduled splenectomy should receive 1 dose of Hib vaccine. Regardless of previous immunization, a recipient of a hematopoietic stem cell transplant should receive a  3-dose series 6-12 months after her successful transplant. Hib vaccine is not recommended for adults with HIV infection. Preventive Services / Frequency Ages 35 to 4 years  Blood pressure check.** / Every 3-5 years.  Lipid and cholesterol check.** / Every 5 years beginning at age 60.  Clinical breast exam.** / Every 3 years for women in their 71s and 10s.  BRCA-related cancer risk assessment.** / For women who have family members with a BRCA-related cancer (breast, ovarian, tubal, or peritoneal cancers).  Pap test.** / Every 2 years from ages 76 through 26. Every 3 years starting at age 61 through age 76 or 93 with a history of 3 consecutive normal Pap tests.  HPV screening.** / Every 3 years from ages 37 through ages 60 to 51 with a history of 3 consecutive normal Pap tests.  Hepatitis C blood test.** / For any individual with known risks for hepatitis C.  Skin self-exam. / Monthly.  Influenza vaccine. / Every year.  Tetanus, diphtheria, and acellular pertussis (Tdap, Td) vaccine.** / Consult your health care provider. Pregnant women should receive 1 dose of Tdap vaccine during each pregnancy. 1 dose of Td every 10 years.  Varicella vaccine.** / Consult your health care provider. Pregnant females who do not have evidence of immunity should receive the first dose after pregnancy.  HPV vaccine. / 3 doses over 6 months, if 93 and younger. The vaccine is not recommended for use in pregnant females. However, pregnancy testing is not needed before receiving a dose.  Measles, mumps, rubella (MMR) vaccine.** / You need at least 1 dose of MMR if you were born in 1957 or later. You may also need a 2nd dose. For females of childbearing age, rubella immunity should be determined. If there is no evidence of immunity, females who are not pregnant should be vaccinated. If there is no evidence of immunity, females who are  pregnant should delay immunization until after pregnancy.  Pneumococcal  13-valent conjugate (PCV13) vaccine.** / Consult your health care provider.  Pneumococcal polysaccharide (PPSV23) vaccine.** / 1 to 2 doses if you smoke cigarettes or if you have certain conditions.  Meningococcal vaccine.** / 1 dose if you are age 68 to 8 years and a Market researcher living in a residence hall, or have one of several medical conditions, you need to get vaccinated against meningococcal disease. You may also need additional booster doses.  Hepatitis A vaccine.** / Consult your health care provider.  Hepatitis B vaccine.** / Consult your health care provider.  Haemophilus influenzae type b (Hib) vaccine.** / Consult your health care provider. Ages 7 to 53 years  Blood pressure check.** / Every year.  Lipid and cholesterol check.** / Every 5 years beginning at age 25 years.  Lung cancer screening. / Every year if you are aged 11-80 years and have a 30-pack-year history of smoking and currently smoke or have quit within the past 15 years. Yearly screening is stopped once you have quit smoking for at least 15 years or develop a health problem that would prevent you from having lung cancer treatment.  Clinical breast exam.** / Every year after age 48 years.  BRCA-related cancer risk assessment.** / For women who have family members with a BRCA-related cancer (breast, ovarian, tubal, or peritoneal cancers).  Mammogram.** / Every year beginning at age 41 years and continuing for as long as you are in good health. Consult with your health care provider.  Pap test.** / Every 3 years starting at age 65 years through age 37 or 70 years with a history of 3 consecutive normal Pap tests.  HPV screening.** / Every 3 years from ages 72 years through ages 60 to 40 years with a history of 3 consecutive normal Pap tests.  Fecal occult blood test (FOBT) of stool. / Every year beginning at age 21 years and continuing until age 5 years. You may not need to do this test if you get  a colonoscopy every 10 years.  Flexible sigmoidoscopy or colonoscopy.** / Every 5 years for a flexible sigmoidoscopy or every 10 years for a colonoscopy beginning at age 35 years and continuing until age 48 years.  Hepatitis C blood test.** / For all people born from [birth year] through 1965 and any individual with known risks for hepatitis C.  Skin self-exam. / Monthly.  Influenza vaccine. / Every year.  Tetanus, diphtheria, and acellular pertussis (Tdap/Td) vaccine.** / Consult your health care provider. Pregnant women should receive 1 dose of Tdap vaccine during each pregnancy. 1 dose of Td every 10 years.  Varicella vaccine.** / Consult your health care provider. Pregnant females who do not have evidence of immunity should receive the first dose after pregnancy.  Zoster vaccine.** / 1 dose for adults aged 30 years or older.  Measles, mumps, rubella (MMR) vaccine.** / You need at least 1 dose of MMR if you were born in 1957 or later. You may also need a second dose. For females of childbearing age, rubella immunity should be determined. If there is no evidence of immunity, females who are not pregnant should be vaccinated. If there is no evidence of immunity, females who are pregnant should delay immunization until after pregnancy.  Pneumococcal 13-valent conjugate (PCV13) vaccine.** / Consult your health care provider.  Pneumococcal polysaccharide (PPSV23) vaccine.** / 1 to 2 doses if you smoke cigarettes or if you have certain conditions.  Meningococcal vaccine.** /  Consult your health care provider.  Hepatitis A vaccine.** / Consult your health care provider.  Hepatitis B vaccine.** / Consult your health care provider.  Haemophilus influenzae type b (Hib) vaccine.** / Consult your health care provider. Ages 64 years and over  Blood pressure check.** / Every year.  Lipid and cholesterol check.** / Every 5 years beginning at age 23 years.  Lung cancer screening. / Every year if you  are aged 16-80 years and have a 30-pack-year history of smoking and currently smoke or have quit within the past 15 years. Yearly screening is stopped once you have quit smoking for at least 15 years or develop a health problem that would prevent you from having lung cancer treatment.  Clinical breast exam.** / Every year after age 74 years.  BRCA-related cancer risk assessment.** / For women who have family members with a BRCA-related cancer (breast, ovarian, tubal, or peritoneal cancers).  Mammogram.** / Every year beginning at age 44 years and continuing for as long as you are in good health. Consult with your health care provider.  Pap test.** / Every 3 years starting at age 58 years through age 22 or 39 years with 3 consecutive normal Pap tests. Testing can be stopped between 65 and 70 years with 3 consecutive normal Pap tests and no abnormal Pap or HPV tests in the past 10 years.  HPV screening.** / Every 3 years from ages 64 years through ages 70 or 61 years with a history of 3 consecutive normal Pap tests. Testing can be stopped between 65 and 70 years with 3 consecutive normal Pap tests and no abnormal Pap or HPV tests in the past 10 years.  Fecal occult blood test (FOBT) of stool. / Every year beginning at age 40 years and continuing until age 27 years. You may not need to do this test if you get a colonoscopy every 10 years.  Flexible sigmoidoscopy or colonoscopy.** / Every 5 years for a flexible sigmoidoscopy or every 10 years for a colonoscopy beginning at age 7 years and continuing until age 32 years.  Hepatitis C blood test.** / For all people born from [birth year] through 1966 and any individual with known risks for hepatitis C.  Osteoporosis screening.** / A one-time screening for women ages 30 years and over and women at risk for fractures or osteoporosis.  Skin self-exam. / Monthly.  Influenza vaccine. / Every year.  Tetanus, diphtheria, and acellular pertussis (Tdap/Td)  vaccine.** / 1 dose of Td every 10 years.  Varicella vaccine.** / Consult your health care provider.  Zoster vaccine.** / 1 dose for adults aged 35 years or older.  Pneumococcal 13-valent conjugate (PCV13) vaccine.** / Consult your health care provider.  Pneumococcal polysaccharide (PPSV23) vaccine.** / 1 dose for all adults aged 46 years and older.  Meningococcal vaccine.** / Consult your health care provider.  Hepatitis A vaccine.** / Consult your health care provider.  Hepatitis B vaccine.** / Consult your health care provider.  Haemophilus influenzae type b (Hib) vaccine.** / Consult your health care provider. ** Family history and personal history of risk and conditions may change your health care provider's recommendations.   This information is not intended to replace advice given to you by your health care provider. Make sure you discuss any questions you have with your health care provider.   Document Released: 08/07/2001 Document Revised: 07/02/2014 Document Reviewed: 11/06/2010 Elsevier Interactive Patient Education [employer].

## 2015-06-27 NOTE — Progress Notes (Signed)
  Subjective:     Whitney Contreras is a 40 y.o. female and is here for a comprehensive physical exam. The patient reports no problems.  Social History   Social History  . Marital Status: Married    Spouse Name: N/A  . Number of Children: N/A  . Years of Education: N/A   Occupational History  . Not on file.   Social History Main Topics  . Smoking status: Current Every Day Smoker -- 0.50 packs/day for 15 years    Types: Cigarettes  . Smokeless tobacco: Never Used  . Alcohol Use: Yes     Comment: rare  . Drug Use: No  . Sexual Activity:    Partners: Male    Birth Control/ Protection: Surgical   Other Topics Concern  . Not on file   Social History Narrative   Health Maintenance  Topic Date Due  . PAP SMEAR  03/29/2015  . INFLUENZA VACCINE  06/26/2016 (Originally 01/24/2015)  . TETANUS/TDAP  10/04/2022  . HIV Screening  Completed    The following portions of the patient's history were reviewed and updated as appropriate: allergies, current medications, past family history, past medical history, past social history, past surgical history and problem list.  Review of Systems Pertinent items noted in HPI and remainder of comprehensive ROS otherwise negative.   Objective:    BP 109/72 mmHg  Pulse 88  Resp 18  Ht 5\' 4"  (1.626 m)  Wt 146 lb (66.225 kg)  BMI 25.05 kg/m2  LMP 05/24/2015 General appearance: alert, cooperative and appears stated age Head: Normocephalic, without obvious abnormality, atraumatic Neck: no adenopathy, supple, symmetrical, trachea midline and thyroid not enlarged, symmetric, no tenderness/mass/nodules Lungs: clear to auscultation bilaterally Breasts: normal appearance, no masses or tenderness Heart: regular rate and rhythm, S1, S2 normal, no murmur, click, rub or gallop Abdomen: soft, non-tender; bowel sounds normal; no masses,  no organomegaly Pelvic: cervix normal in appearance, external genitalia normal, no adnexal masses or tenderness, no  cervical motion tenderness, uterus normal size, shape, and consistency and vagina normal without discharge Extremities: extremities normal, atraumatic, no cyanosis or edema Pulses: 2+ and symmetric Skin: Skin color, texture, turgor normal. No rashes or lesions Lymph nodes: Cervical, supraclavicular, and axillary nodes normal. Neurologic: Grossly normal    Assessment:    Healthy female exam.      Plan:      Problem List Items Addressed This Visit    None    Visit Diagnoses    Encounter for routine gynecological examination    -  Primary    Relevant Orders    MM DIGITAL SCREENING BILATERAL    Screening for malignant neoplasm of cervix        Relevant Orders    Cytology - PAP       See After Visit Summary for Counseling Recommendations

## 2015-07-01 LAB — CYTOLOGY - PAP

## 2015-07-12 ENCOUNTER — Ambulatory Visit
Admission: RE | Admit: 2015-07-12 | Discharge: 2015-07-12 | Disposition: A | Discharge status: Home / Self Care | Payer: BLUE CROSS/BLUE SHIELD | Source: Ambulatory Visit | Attending: Family Medicine | Admitting: Family Medicine

## 2015-07-12 DIAGNOSIS — Z01419 Encounter for gynecological examination (general) (routine) without abnormal findings: Secondary | ICD-10-CM

## 2015-07-12 DIAGNOSIS — Z1231 Encounter for screening mammogram for malignant neoplasm of breast: Secondary | ICD-10-CM | POA: Diagnosis not present

## 2015-07-15 ENCOUNTER — Telehealth: Payer: Self-pay | Admitting: *Deleted

## 2015-07-15 DIAGNOSIS — B009 Herpesviral infection, unspecified: Secondary | ICD-10-CM

## 2015-07-15 DIAGNOSIS — G4700 Insomnia, unspecified: Secondary | ICD-10-CM

## 2015-07-15 MED ORDER — VALACYCLOVIR HCL 500 MG PO TABS: 500.0000 mg | ORAL_TABLET | ORAL | Status: AC | PRN

## 2015-07-15 MED ORDER — ALPRAZOLAM 0.5 MG PO TABS: 0.2500 mg | ORAL_TABLET | Freq: Every evening | ORAL | Status: DC | PRN

## 2015-07-15 NOTE — Telephone Encounter (Signed)
Dr Kennon Rounds approved rx refill, sent rx to pharmacy.

## 2015-07-15 NOTE — Telephone Encounter (Signed)
-----   Message from Francia Greaves sent at 07/15/2015 11:28 AM EST ----- Regarding: Refill Request Contact: [phone] Wants a refill on Xanax and Valtrex Uses [facility] in [place]

## 2015-11-17 ENCOUNTER — Telehealth: Payer: Self-pay | Admitting: *Deleted

## 2015-11-17 NOTE — Telephone Encounter (Signed)
-----   Message from Francia Greaves sent at 11/17/2015 11:25 AM EDT ----- Regarding: Refill Request Contact: [phone] Requesting a refill on Xanax Uses [facility] in [place]

## 2015-11-22 ENCOUNTER — Telehealth: Payer: Self-pay | Admitting: *Deleted

## 2015-11-22 DIAGNOSIS — G4700 Insomnia, unspecified: Secondary | ICD-10-CM

## 2015-11-22 MED ORDER — ALPRAZOLAM 0.5 MG PO TABS: 0.2500 mg | ORAL_TABLET | Freq: Every evening | ORAL | Status: DC | PRN

## 2015-11-22 NOTE — Telephone Encounter (Signed)
-----   Message from Donnamae Jude, MD sent at 11/18/2015 10:34 AM EDT ----- Regarding: RE: Refill Request Contact: [phone] [place] to refill # 32 + 3 refills ----- Message -----    From: Gretchen Short, CMA    Sent: 11/17/2015   1:18 PM      To: Donnamae Jude, MD Subject: Melton Alar: Refill Request                             Please advise on refill request.   Thank you MH   ----- Message -----    From: Francia Greaves    Sent: 11/17/2015  11:25 AM      To: Gretchen Short, CMA Subject: Refill Request                                 Requesting a refill on Xanax Uses [facility] in [place]

## 2015-11-22 NOTE — Telephone Encounter (Signed)
Called in Rx to [facility].

## 2016-06-12 ENCOUNTER — Telehealth: Payer: Self-pay

## 2016-06-12 NOTE — Telephone Encounter (Signed)
Called patient multiple times, her name was on the January 2018 wait list to schedule her yearly exam, called patient, no answer, have left messages (2). Postcard mailed to home address. Named removed from wait list

## 2016-07-30 ENCOUNTER — Telehealth: Payer: Self-pay | Admitting: *Deleted

## 2016-07-30 DIAGNOSIS — G4700 Insomnia, unspecified: Secondary | ICD-10-CM

## 2016-07-30 MED ORDER — ALPRAZOLAM 0.5 MG PO TABS: 0.2500 mg | ORAL_TABLET | Freq: Every evening | ORAL | 0 refills | Status: DC | PRN

## 2016-07-30 NOTE — Telephone Encounter (Signed)
-----   Message from Donnamae Jude, MD sent at 07/27/2016 11:53 AM EST ----- Regarding: RE: Rx refill Contact: [phone] [place] to refill x 1 thanks ----- Message ----- From: Ricka Burdock, RN Sent: 07/26/2016   4:56 PM To: Donnamae Jude, MD Subject: Melton Alar: Rx refill                                  Pt requesting refill on Xanax, has appt scheduled with you on 08-21-16. ----- Message ----- From: Blanchie Dessert, NT Sent: 07/26/2016   2:34 PM To: Ricka Burdock, RN Subject: Rx refill                                      Pt called to schedule appt and asked that a refill be called in for her Alprazolam to the [facility] in [place].. Pt was last seen 01/17 by Dr Kennon Rounds but Rx was written for 11/22/15..   I told pt that we would call her back if Rx could not be refilled..   Thanks ;0)

## 2016-07-30 NOTE — Telephone Encounter (Signed)
Phoned refill to the pharmacy per Dr Kennon Rounds order.

## 2016-08-03 DIAGNOSIS — M7542 Impingement syndrome of left shoulder: Secondary | ICD-10-CM | POA: Diagnosis not present

## 2016-08-21 ENCOUNTER — Ambulatory Visit: Payer: BLUE CROSS/BLUE SHIELD | Admitting: Family Medicine

## 2016-09-04 ENCOUNTER — Ambulatory Visit: Payer: BLUE CROSS/BLUE SHIELD | Admitting: Family Medicine

## 2016-10-02 ENCOUNTER — Encounter: Payer: Self-pay | Admitting: Family Medicine

## 2016-10-02 ENCOUNTER — Ambulatory Visit (INDEPENDENT_AMBULATORY_CARE_PROVIDER_SITE_OTHER): Payer: BLUE CROSS/BLUE SHIELD | Admitting: Family Medicine

## 2016-10-02 VITALS — BP 110/75 | HR 111 | Ht 64.0 in | Wt 140.0 lb

## 2016-10-02 DIAGNOSIS — Z124 Encounter for screening for malignant neoplasm of cervix: Secondary | ICD-10-CM

## 2016-10-02 DIAGNOSIS — Z720 Tobacco use: Secondary | ICD-10-CM

## 2016-10-02 DIAGNOSIS — Z01419 Encounter for gynecological examination (general) (routine) without abnormal findings: Secondary | ICD-10-CM | POA: Diagnosis not present

## 2016-10-02 DIAGNOSIS — G4700 Insomnia, unspecified: Secondary | ICD-10-CM

## 2016-10-02 MED ORDER — BUPROPION HCL 75 MG PO TABS: 75.0000 mg | ORAL_TABLET | Freq: Two times a day (BID) | ORAL | 2 refills | Status: DC

## 2016-10-02 MED ORDER — ALPRAZOLAM 0.5 MG PO TABS: 0.2500 mg | ORAL_TABLET | Freq: Every evening | ORAL | 0 refills | Status: DC | PRN

## 2016-10-02 NOTE — Patient Instructions (Signed)
Preventive Care 18-39 Years, Female Preventive care refers to lifestyle choices and visits with your health care provider that can promote health and wellness. What does preventive care include?  A yearly physical exam. This is also called an annual well check.  Dental exams once or twice a year.  Routine eye exams. Ask your health care provider how often you should have your eyes checked.  Personal lifestyle choices, including:  Daily care of your teeth and gums.  Regular physical activity.  Eating a healthy diet.  Avoiding tobacco and drug use.  Limiting alcohol use.  Practicing safe sex.  Taking vitamin and mineral supplements as recommended by your health care provider. What happens during an annual well check? The services and screenings done by your health care provider during your annual well check will depend on your age, overall health, lifestyle risk factors, and family history of disease. Counseling  Your health care provider may ask you questions about your:  Alcohol use.  Tobacco use.  Drug use.  Emotional well-being.  Home and relationship well-being.  Sexual activity.  Eating habits.  Work and work environment.  Method of birth control.  Menstrual cycle.  Pregnancy history. Screening  You may have the following tests or measurements:  Height, weight, and BMI.  Diabetes screening. This is done by checking your blood sugar (glucose) after you have not eaten for a while (fasting).  Blood pressure.  Lipid and cholesterol levels. These may be checked every 5 years starting at age 20.  Skin check.  Hepatitis C blood test.  Hepatitis B blood test.  Sexually transmitted disease (STD) testing.  BRCA-related cancer screening. This may be done if you have a family history of breast, ovarian, tubal, or peritoneal cancers.  Pelvic exam and Pap test. This may be done every 3 years starting at age 21. Starting at age 30, this may be done every 5  years if you have a Pap test in combination with an HPV test. Discuss your test results, treatment options, and if necessary, the need for more tests with your health care provider. Vaccines  Your health care provider may recommend certain vaccines, such as:  Influenza vaccine. This is recommended every year.  Tetanus, diphtheria, and acellular pertussis (Tdap, Td) vaccine. You may need a Td booster every 10 years.  Varicella vaccine. You may need this if you have not been vaccinated.  HPV vaccine. If you are 26 or younger, you may need three doses over 6 months.  Measles, mumps, and rubella (MMR) vaccine. You may need at least one dose of MMR. You may also need a second dose.  Pneumococcal 13-valent conjugate (PCV13) vaccine. You may need this if you have certain conditions and were not previously vaccinated.  Pneumococcal polysaccharide (PPSV23) vaccine. You may need one or two doses if you smoke cigarettes or if you have certain conditions.  Meningococcal vaccine. One dose is recommended if you are age 19-21 years and a first-year college student living in a residence hall, or if you have one of several medical conditions. You may also need additional booster doses.  Hepatitis A vaccine. You may need this if you have certain conditions or if you travel or work in places where you may be exposed to hepatitis A.  Hepatitis B vaccine. You may need this if you have certain conditions or if you travel or work in places where you may be exposed to hepatitis B.  Haemophilus influenzae type b (Hib) vaccine. You may need this   if you have certain risk factors. Talk to your health care provider about which screenings and vaccines you need and how often you need them. This information is not intended to replace advice given to you by your health care provider. Make sure you discuss any questions you have with your health care provider. Document Released: 08/07/2001 Document Revised: 02/29/2016  Document Reviewed: 04/12/2015 Elsevier Interactive Patient Education  2017 [employer].

## 2016-10-02 NOTE — Progress Notes (Signed)
  Subjective:     Whitney Contreras is a 41 y.o. female and is here for a comprehensive physical exam. The patient reports problems - post BTL. Having sometimes heavy cycles and some diarrhea associated. Has h/o endometriosis.  Social History   Social History  . Marital status: Married    Spouse name: N/A  . Number of children: N/A  . Years of education: N/A   Occupational History  . Not on file.   Social History Main Topics  . Smoking status: Current Every Day Smoker    Packs/day: 0.50    Years: 15.00    Types: Cigarettes  . Smokeless tobacco: Never Used  . Alcohol use Yes     Comment: rare  . Drug use: No  . Sexual activity: Yes    Partners: Male    Birth control/ protection: Surgical   Other Topics Concern  . Not on file   Social History Narrative  . No narrative on file   Health Maintenance  Topic Date Due  . INFLUENZA VACCINE  01/23/2017  . PAP SMEAR  06/26/2018  . TETANUS/TDAP  10/04/2022  . HIV Screening  Completed    The following portions of the patient's history were reviewed and updated as appropriate: allergies, current medications, past family history, past medical history, past social history, past surgical history and problem list.  Review of Systems Pertinent items noted in HPI and remainder of comprehensive ROS otherwise negative.   Objective:    BP 110/75   Pulse (!) 111   Ht 5\' 4"  (1.626 m)   Wt 140 lb (63.5 kg)   LMP 09/11/2016   BMI 24.03 kg/m  General appearance: alert, cooperative and appears stated age Head: Normocephalic, without obvious abnormality, atraumatic Neck: no adenopathy, supple, symmetrical, trachea midline and thyroid not enlarged, symmetric, no tenderness/mass/nodules Lungs: clear to auscultation bilaterally Breasts: normal appearance, no masses or tenderness Heart: regular rate and rhythm, S1, S2 normal, no murmur, click, rub or gallop Abdomen: soft, non-tender; bowel sounds normal; no masses,  no  organomegaly Extremities: extremities normal, atraumatic, no cyanosis or edema Pulses: 2+ and symmetric Skin: Skin color, texture, turgor normal. No rashes or lesions Lymph nodes: Cervical, supraclavicular, and axillary nodes normal.    Assessment:    Healthy female exam.      Plan:   Problem List Items Addressed This Visit      Unprioritized   Tobacco use    Other Visit Diagnoses    Encounter for gynecological examination without abnormal finding    -  Primary   Relevant Orders   MM DIGITAL SCREENING BILATERAL   Insomnia, unspecified type       Relevant Medications   ALPRAZolam (XANAX) 0.5 MG tablet   Screening for malignant neoplasm of cervix         Smoking cessation reviewed at length. Had blood work through [employer] job Had low vitamin D   See After Visit Summary for Counseling Recommendations

## 2016-10-02 NOTE — Progress Notes (Signed)
Last MM 06/2015 - Normal - Ordered today Last PAP 06/2015 - Normal - Declined PAP for this year Needs flu vaccine - Declined Pt needs refill on Xanax - Order pended

## 2016-10-23 ENCOUNTER — Ambulatory Visit
Admission: RE | Admit: 2016-10-23 | Discharge: 2016-10-23 | Disposition: A | Discharge status: Home / Self Care | Payer: BLUE CROSS/BLUE SHIELD | Source: Ambulatory Visit | Attending: Family Medicine | Admitting: Family Medicine

## 2016-10-23 DIAGNOSIS — Z01419 Encounter for gynecological examination (general) (routine) without abnormal findings: Secondary | ICD-10-CM

## 2016-10-23 DIAGNOSIS — Z1231 Encounter for screening mammogram for malignant neoplasm of breast: Secondary | ICD-10-CM | POA: Insufficient documentation

## 2017-01-23 ENCOUNTER — Other Ambulatory Visit: Payer: Self-pay | Admitting: Family Medicine

## 2017-01-23 DIAGNOSIS — G4700 Insomnia, unspecified: Secondary | ICD-10-CM

## 2017-01-23 NOTE — Telephone Encounter (Signed)
Xanax called into [facility].

## 2017-03-27 DIAGNOSIS — Z79899 Other long term (current) drug therapy: Secondary | ICD-10-CM | POA: Diagnosis not present

## 2017-03-27 DIAGNOSIS — D239 Other benign neoplasm of skin, unspecified: Secondary | ICD-10-CM | POA: Diagnosis not present

## 2017-03-27 DIAGNOSIS — L700 Acne vulgaris: Secondary | ICD-10-CM | POA: Diagnosis not present

## 2017-03-27 DIAGNOSIS — L905 Scar conditions and fibrosis of skin: Secondary | ICD-10-CM | POA: Diagnosis not present

## 2017-06-27 DIAGNOSIS — L700 Acne vulgaris: Secondary | ICD-10-CM | POA: Diagnosis not present

## 2017-06-27 DIAGNOSIS — Z79899 Other long term (current) drug therapy: Secondary | ICD-10-CM | POA: Diagnosis not present

## 2017-07-11 DIAGNOSIS — L2489 Irritant contact dermatitis due to other agents: Secondary | ICD-10-CM | POA: Diagnosis not present

## 2017-08-23 ENCOUNTER — Telehealth: Payer: Self-pay | Admitting: Family Medicine

## 2017-08-23 DIAGNOSIS — G4700 Insomnia, unspecified: Secondary | ICD-10-CM

## 2017-08-23 MED ORDER — ALPRAZOLAM 0.5 MG PO TABS: ORAL_TABLET | ORAL | 3 refills | Status: DC

## 2017-08-23 NOTE — Telephone Encounter (Signed)
-----   Message from Cindie Crumbly, [place] sent at 08/22/2017 11:11 AM EST ----- Regarding: FW: refill rx Contact: [phone]   ----- Message ----- From: Blanchie Dessert, NT Sent: 08/22/2017  10:24 AM To: [facility] Subject: refill rx                                      Please send refill to [facility] for Xanax

## 2017-10-15 ENCOUNTER — Ambulatory Visit: Payer: BLUE CROSS/BLUE SHIELD | Admitting: Family Medicine

## 2017-11-13 NOTE — Progress Notes (Signed)
Last pap 06/27/2015- normal  Last MM - 10/2016 - Normal Wants to talk about rash on lower legs and side effects of Wellbutrin.

## 2017-11-14 ENCOUNTER — Ambulatory Visit (INDEPENDENT_AMBULATORY_CARE_PROVIDER_SITE_OTHER): Payer: BLUE CROSS/BLUE SHIELD | Admitting: Family Medicine

## 2017-11-14 ENCOUNTER — Encounter: Payer: Self-pay | Admitting: Family Medicine

## 2017-11-14 VITALS — BP 109/73 | HR 71 | Wt 141.0 lb

## 2017-11-14 DIAGNOSIS — Z01419 Encounter for gynecological examination (general) (routine) without abnormal findings: Secondary | ICD-10-CM | POA: Diagnosis not present

## 2017-11-14 DIAGNOSIS — Z124 Encounter for screening for malignant neoplasm of cervix: Secondary | ICD-10-CM

## 2017-11-14 DIAGNOSIS — Z1151 Encounter for screening for human papillomavirus (HPV): Secondary | ICD-10-CM | POA: Diagnosis not present

## 2017-11-14 DIAGNOSIS — Z720 Tobacco use: Secondary | ICD-10-CM

## 2017-11-14 DIAGNOSIS — F411 Generalized anxiety disorder: Secondary | ICD-10-CM | POA: Insufficient documentation

## 2017-11-14 NOTE — Assessment & Plan Note (Signed)
Trial of mindfulness, yoga, routine setting. If still not helpful, may re-try Wellbutrin. Advised usual side fx diminish after first 1-2 wks. She reports only trying this for a few days. Also, will not see effects until 4-6 wks. May help with tobacco use--so may consider this again in the future.

## 2017-11-14 NOTE — Patient Instructions (Signed)
Preventive Care 18-39 Years, Female Preventive care refers to lifestyle choices and visits with your health care provider that can promote health and wellness. What does preventive care include?  A yearly physical exam. This is also called an annual well check.  Dental exams once or twice a year.  Routine eye exams. Ask your health care provider how often you should have your eyes checked.  Personal lifestyle choices, including: ? Daily care of your teeth and gums. ? Regular physical activity. ? Eating a healthy diet. ? Avoiding tobacco and drug use. ? Limiting alcohol use. ? Practicing safe sex. ? Taking vitamin and mineral supplements as recommended by your health care provider. What happens during an annual well check? The services and screenings done by your health care provider during your annual well check will depend on your age, overall health, lifestyle risk factors, and family history of disease. Counseling Your health care provider may ask you questions about your:  Alcohol use.  Tobacco use.  Drug use.  Emotional well-being.  Home and relationship well-being.  Sexual activity.  Eating habits.  Work and work Statistician.  Method of birth control.  Menstrual cycle.  Pregnancy history.  Screening You may have the following tests or measurements:  Height, weight, and BMI.  Diabetes screening. This is done by checking your blood sugar (glucose) after you have not eaten for a while (fasting).  Blood pressure.  Lipid and cholesterol levels. These may be checked every 5 years starting at age 38.  Skin check.  Hepatitis C blood test.  Hepatitis B blood test.  Sexually transmitted disease (STD) testing.  BRCA-related cancer screening. This may be done if you have a family history of breast, ovarian, tubal, or peritoneal cancers.  Pelvic exam and Pap test. This may be done every 3 years starting at age 38. Starting at age 30, this may be done  every 5 years if you have a Pap test in combination with an HPV test.  Discuss your test results, treatment options, and if necessary, the need for more tests with your health care provider. Vaccines Your health care provider may recommend certain vaccines, such as:  Influenza vaccine. This is recommended every year.  Tetanus, diphtheria, and acellular pertussis (Tdap, Td) vaccine. You may need a Td booster every 10 years.  Varicella vaccine. You may need this if you have not been vaccinated.  HPV vaccine. If you are 39 or younger, you may need three doses over 6 months.  Measles, mumps, and rubella (MMR) vaccine. You may need at least one dose of MMR. You may also need a second dose.  Pneumococcal 13-valent conjugate (PCV13) vaccine. You may need this if you have certain conditions and were not previously vaccinated.  Pneumococcal polysaccharide (PPSV23) vaccine. You may need one or two doses if you smoke cigarettes or if you have certain conditions.  Meningococcal vaccine. One dose is recommended if you are age 68-21 years and a first-year college student living in a residence hall, or if you have one of several medical conditions. You may also need additional booster doses.  Hepatitis A vaccine. You may need this if you have certain conditions or if you travel or work in places where you may be exposed to hepatitis A.  Hepatitis B vaccine. You may need this if you have certain conditions or if you travel or work in places where you may be exposed to hepatitis B.  Haemophilus influenzae type b (Hib) vaccine. You may need this  if you have certain risk factors.  Talk to your health care provider about which screenings and vaccines you need and how often you need them. This information is not intended to replace advice given to you by your health care provider. Make sure you discuss any questions you have with your health care provider. Document Released: 08/07/2001 Document Revised:  02/29/2016 Document Reviewed: 04/12/2015 Elsevier Interactive Patient Education  2018 [employer] Inc[employer]  

## 2017-11-14 NOTE — Progress Notes (Signed)
Subjective:     Whitney Contreras is a 42 y.o. female and is here for a comprehensive physical exam. The patient reports problems - rash.notes rash on feet x 6 months without itching. Stopped her Wellbutrin due to feeling light-headed and did not last. She reports anxiety which is at times debilitating. She has trouble sleeping.She has changed from cigarettes to Juul use. She has a globulus feeling in her throat. Is going to see ENT.   Social History   Socioeconomic History  . Marital status: Married    Spouse name: Not on file  . Number of children: Not on file  . Years of education: Not on file  . Highest education level: Not on file  Occupational History  . Not on file  Social Needs  . Financial resource strain: Not on file  . Food insecurity:    Worry: Not on file    Inability: Not on file  . Transportation needs:    Medical: Not on file    Non-medical: Not on file  Tobacco Use  . Smoking status: Current Every Day Smoker    Packs/day: 0.50    Years: 15.00    Pack years: 7.50    Types: Cigarettes  . Smokeless tobacco: Never Used  Substance and Sexual Activity  . Alcohol use: Yes    Comment: rare  . Drug use: No  . Sexual activity: Yes    Partners: Male    Birth control/protection: Surgical  Lifestyle  . Physical activity:    Days per week: Not on file    Minutes per session: Not on file  . Stress: Not on file  Relationships  . Social connections:    Talks on phone: Not on file    Gets together: Not on file    Attends religious service: Not on file    Active member of club or organization: Not on file    Attends meetings of clubs or organizations: Not on file    Relationship status: Not on file  . Intimate partner violence:    Fear of current or ex partner: Not on file    Emotionally abused: Not on file    Physically abused: Not on file    Forced sexual activity: Not on file  Other Topics Concern  . Not on file  Social History Narrative  . Not on file    Health Maintenance  Topic Date Due  . INFLUENZA VACCINE  01/23/2018  . PAP SMEAR  06/26/2018  . TETANUS/TDAP  10/04/2022  . HIV Screening  Completed    The following portions of the patient's history were reviewed and updated as appropriate: allergies, current medications, past family history, past medical history, past social history, past surgical history and problem list.  Review of Systems Pertinent items noted in HPI and remainder of comprehensive ROS otherwise negative.   Objective:    BP 109/73   Pulse 71   Wt 141 lb (64 kg)   BMI 24.20 kg/m  General appearance: alert, cooperative and appears stated age Head: Normocephalic, without obvious abnormality, atraumatic Neck: no adenopathy, supple, symmetrical, trachea midline and thyroid not enlarged, symmetric, no tenderness/mass/nodules Lungs: clear to auscultation bilaterally Breasts: normal appearance, no masses or tenderness Heart: regular rate and rhythm Abdomen: soft, non-tender; bowel sounds normal; no masses,  no organomegaly Pelvic: cervix normal in appearance, external genitalia normal, no adnexal masses or tenderness, no cervical motion tenderness, uterus normal size, shape, and consistency and vagina normal without discharge Extremities: extremities normal,  atraumatic, no cyanosis or edema Pulses: 2+ and symmetric Skin: small petehical lesions noted on feet bilaterally Lymph nodes: Cervical, supraclavicular, and axillary nodes normal. Neurologic: Grossly normal    Assessment:    Healthy female exam.      Plan:      Problem List Items Addressed This Visit      Unprioritized   Tobacco use    Encouraged her to quit      Generalized anxiety disorder    Trial of mindfulness, yoga, routine setting. If still not helpful, may re-try Wellbutrin. Advised usual side fx diminish after first 1-2 wks. She reports only trying this for a few days. Also, will not see effects until 4-6 wks. May help with tobacco  use--so may consider this again in the future.       Other Visit Diagnoses    Well woman exam with routine gynecological exam    -  Primary   Relevant Orders   Cytology - PAP   MM 3D SCREEN BREAST BILATERAL   Screening for malignant neoplasm of cervix         Return in 1 year (on 11/15/2018).  See After Visit Summary for Counseling Recommendations

## 2017-11-14 NOTE — Assessment & Plan Note (Signed)
Encouraged her to quit 

## 2017-11-20 LAB — CYTOLOGY - PAP
DIAGNOSIS: NEGATIVE
HPV (WINDOPATH): NOT DETECTED

## 2017-12-02 DIAGNOSIS — K219 Gastro-esophageal reflux disease without esophagitis: Secondary | ICD-10-CM | POA: Diagnosis not present

## 2017-12-11 ENCOUNTER — Ambulatory Visit
Admission: RE | Admit: 2017-12-11 | Discharge: 2017-12-11 | Disposition: A | Discharge status: Home / Self Care | Payer: BLUE CROSS/BLUE SHIELD | Source: Ambulatory Visit | Attending: Family Medicine | Admitting: Family Medicine

## 2017-12-11 DIAGNOSIS — Z01419 Encounter for gynecological examination (general) (routine) without abnormal findings: Secondary | ICD-10-CM | POA: Diagnosis not present

## 2017-12-11 DIAGNOSIS — Z1231 Encounter for screening mammogram for malignant neoplasm of breast: Secondary | ICD-10-CM | POA: Diagnosis not present

## 2018-01-13 DIAGNOSIS — R070 Pain in throat: Secondary | ICD-10-CM | POA: Diagnosis not present

## 2018-01-13 DIAGNOSIS — F458 Other somatoform disorders: Secondary | ICD-10-CM | POA: Diagnosis not present

## 2018-01-16 ENCOUNTER — Encounter: Payer: Self-pay | Admitting: Gastroenterology

## 2018-03-01 ENCOUNTER — Other Ambulatory Visit: Payer: Self-pay | Admitting: Family Medicine

## 2018-03-01 DIAGNOSIS — G4700 Insomnia, unspecified: Secondary | ICD-10-CM

## 2018-03-01 MED ORDER — ALPRAZOLAM 0.5 MG PO TABS: ORAL_TABLET | ORAL | 3 refills | Status: DC

## 2018-09-02 ENCOUNTER — Encounter: Payer: Self-pay | Admitting: Radiology

## 2018-09-02 ENCOUNTER — Other Ambulatory Visit: Payer: Self-pay | Admitting: Family Medicine

## 2018-09-02 DIAGNOSIS — G4700 Insomnia, unspecified: Secondary | ICD-10-CM

## 2018-11-20 ENCOUNTER — Other Ambulatory Visit: Payer: Self-pay | Admitting: Family Medicine

## 2018-11-20 DIAGNOSIS — G4700 Insomnia, unspecified: Secondary | ICD-10-CM

## 2019-04-07 ENCOUNTER — Other Ambulatory Visit: Payer: Self-pay | Admitting: Family Medicine

## 2019-04-07 DIAGNOSIS — G4700 Insomnia, unspecified: Secondary | ICD-10-CM

## 2019-04-30 ENCOUNTER — Other Ambulatory Visit: Payer: Self-pay

## 2019-04-30 ENCOUNTER — Ambulatory Visit (INDEPENDENT_AMBULATORY_CARE_PROVIDER_SITE_OTHER): Payer: BLUE CROSS/BLUE SHIELD | Admitting: Family Medicine

## 2019-04-30 ENCOUNTER — Encounter: Payer: Self-pay | Admitting: Family Medicine

## 2019-04-30 VITALS — BP 111/76 | HR 90 | Ht 64.0 in | Wt 147.0 lb

## 2019-04-30 DIAGNOSIS — Z01419 Encounter for gynecological examination (general) (routine) without abnormal findings: Secondary | ICD-10-CM

## 2019-04-30 DIAGNOSIS — Z1151 Encounter for screening for human papillomavirus (HPV): Secondary | ICD-10-CM

## 2019-04-30 DIAGNOSIS — F411 Generalized anxiety disorder: Secondary | ICD-10-CM

## 2019-04-30 DIAGNOSIS — Z124 Encounter for screening for malignant neoplasm of cervix: Secondary | ICD-10-CM | POA: Diagnosis not present

## 2019-04-30 DIAGNOSIS — N920 Excessive and frequent menstruation with regular cycle: Secondary | ICD-10-CM

## 2019-04-30 DIAGNOSIS — Z720 Tobacco use: Secondary | ICD-10-CM

## 2019-04-30 DIAGNOSIS — G4700 Insomnia, unspecified: Secondary | ICD-10-CM

## 2019-04-30 MED ORDER — BUPROPION HCL 75 MG PO TABS: 75.0000 mg | ORAL_TABLET | Freq: Two times a day (BID) | ORAL | 2 refills | Status: DC

## 2019-04-30 MED ORDER — ALPRAZOLAM 0.5 MG PO TABS: ORAL_TABLET | ORAL | 2 refills | Status: DC

## 2019-04-30 NOTE — Progress Notes (Signed)
  Subjective:     Whitney Contreras is a 43 y.o. female and is here for a comprehensive physical exam. The patient reports problems - cycles. Cycles are closer together. Feels fatigued. Bleeding all the time. Worse cramping. Wants to discuss hysterectomy. Also with worsening mood swings and anxiety. Wants to try Wellbutrin again. Stopped last time due to side effects.  The following portions of the patient's history were reviewed and updated as appropriate: allergies, current medications, past family history, past medical history, past social history, past surgical history and problem list.  Review of Systems Pertinent items are noted in HPI.   Objective:    BP 111/76   Pulse 90   Ht 5\' 4"  (1.626 m)   Wt 147 lb (66.7 kg)   BMI 25.23 kg/m  General appearance: alert, cooperative and appears stated age Head: Normocephalic, without obvious abnormality, atraumatic Neck: no adenopathy, supple, symmetrical, trachea midline and thyroid not enlarged, symmetric, no tenderness/mass/nodules Lungs: clear to auscultation bilaterally Breasts: normal appearance, no masses or tenderness Heart: regular rate and rhythm, S1, S2 normal, no murmur, click, rub or gallop Abdomen: soft, non-tender; bowel sounds normal; no masses,  no organomegaly Pelvic: cervix normal in appearance, external genitalia normal, no adnexal masses or tenderness, no cervical motion tenderness, uterus normal size, shape, and consistency and vagina normal without discharge Extremities: extremities normal, atraumatic, no cyanosis or edema Pulses: 2+ and symmetric Skin: Skin color, texture, turgor normal. No rashes or lesions Lymph nodes: Cervical, supraclavicular, and axillary nodes normal. Neurologic: Grossly normal    Assessment:    Healthy female exam.      Plan:      Problem List Items Addressed This Visit      Unprioritized   Tobacco use    Wellbutrin may help with this.      Generalized anxiety disorder - Primary    Discussed Wellbutrin  will help mood, but not necessarily anxiety. Start low and slow, 1/2 tab per day x 5 days, then 1 q d x 5 d , then 1.5 twice daily x 5days, then 1 bid. May need to add for anxiety, Buspar or the like. May need increasing doses. Time of onset of effectiveness discussed.      Relevant Medications   buPROPion (WELLBUTRIN) 75 MG tablet   ALPRAZolam (XANAX) 0.5 MG tablet   Menorrhagia    Discussed options of IUD, oral meds, ablation, hysterectomy. Discussed risks of each. She will consider options and let me know her decision. She has had a salpingectomy.       Other Visit Diagnoses    Screening for malignant neoplasm of cervix       Relevant Orders   PAP over 30   Encounter for gynecological examination without abnormal finding       Relevant Orders   Mammogram Screening Bilateral Tomo   Insomnia, unspecified type       Relevant Medications   ALPRAZolam (XANAX) 0.5 MG tablet      Return in 1 year (on 04/29/2020).  See After Visit Summary for Counseling Recommendations

## 2019-04-30 NOTE — Patient Instructions (Signed)
 Preventive Care 21-44 Years Old, Female Preventive care refers to visits with your health care provider and lifestyle choices that can promote health and wellness. This includes:  A yearly physical exam. This may also be called an annual well check.  Regular dental visits and eye exams.  Immunizations.  Screening for certain conditions.  Healthy lifestyle choices, such as eating a healthy diet, getting regular exercise, not using drugs or products that contain nicotine and tobacco, and limiting alcohol use. What can I expect for my preventive care visit? Physical exam Your health care provider will check your:  Height and weight. This may be used to calculate body mass index (BMI), which tells if you are at a healthy weight.  Heart rate and blood pressure.  Skin for abnormal spots. Counseling Your health care provider may ask you questions about your:  Alcohol, tobacco, and drug use.  Emotional well-being.  Home and relationship well-being.  Sexual activity.  Eating habits.  Work and work environment.  Method of birth control.  Menstrual cycle.  Pregnancy history. What immunizations do I need?  Influenza (flu) vaccine  This is recommended every year. Tetanus, diphtheria, and pertussis (Tdap) vaccine  You may need a Td booster every 10 years. Varicella (chickenpox) vaccine  You may need this if you have not been vaccinated. Human papillomavirus (HPV) vaccine  If recommended by your health care provider, you may need three doses over 6 months. Measles, mumps, and rubella (MMR) vaccine  You may need at least one dose of MMR. You may also need a second dose. Meningococcal conjugate (MenACWY) vaccine  One dose is recommended if you are age 19-21 years and a first-year college student living in a residence hall, or if you have one of several medical conditions. You may also need additional booster doses. Pneumococcal conjugate (PCV13) vaccine  You may need  this if you have certain conditions and were not previously vaccinated. Pneumococcal polysaccharide (PPSV23) vaccine  You may need one or two doses if you smoke cigarettes or if you have certain conditions. Hepatitis A vaccine  You may need this if you have certain conditions or if you travel or work in places where you may be exposed to hepatitis A. Hepatitis B vaccine  You may need this if you have certain conditions or if you travel or work in places where you may be exposed to hepatitis B. Haemophilus influenzae type b (Hib) vaccine  You may need this if you have certain conditions. You may receive vaccines as individual doses or as more than one vaccine together in one shot (combination vaccines). Talk with your health care provider about the risks and benefits of combination vaccines. What tests do I need?  Blood tests  Lipid and cholesterol levels. These may be checked every 5 years starting at age 20.  Hepatitis C test.  Hepatitis B test. Screening  Diabetes screening. This is done by checking your blood sugar (glucose) after you have not eaten for a while (fasting).  Sexually transmitted disease (STD) testing.  BRCA-related cancer screening. This may be done if you have a family history of breast, ovarian, tubal, or peritoneal cancers.  Pelvic exam and Pap test. This may be done every 3 years starting at age 21. Starting at age 30, this may be done every 5 years if you have a Pap test in combination with an HPV test. Talk with your health care provider about your test results, treatment options, and if necessary, the need for more   tests. Follow these instructions at home: Eating and drinking   Eat a diet that includes fresh fruits and vegetables, whole grains, lean protein, and low-fat dairy.  Take vitamin and mineral supplements as recommended by your health care provider.  Do not drink alcohol if: ? Your health care provider tells you not to drink. ? You are  pregnant, may be pregnant, or are planning to become pregnant.  If you drink alcohol: ? Limit how much you have to 0-1 drink a day. ? Be aware of how much alcohol is in your drink. In the [place] one drink equals one 12 oz bottle of beer (355 mL), one 5 oz glass of wine (148 mL), or one 1 oz glass of hard liquor (44 mL). Lifestyle  Take daily care of your teeth and gums.  Stay active. Exercise for at least 30 minutes on 5 or more days each week.  Do not use any products that contain nicotine or tobacco, such as cigarettes, e-cigarettes, and chewing tobacco. If you need help quitting, ask your health care provider.  If you are sexually active, practice safe sex. Use a condom or other form of birth control (contraception) in order to prevent pregnancy and STIs (sexually transmitted infections). If you plan to become pregnant, see your health care provider for a preconception visit. What's next?  Visit your health care provider once a year for a well check visit.  Ask your health care provider how often you should have your eyes and teeth checked.  Stay up to date on all vaccines. This information is not intended to replace advice given to you by your health care provider. Make sure you discuss any questions you have with your health care provider. Document Released: 08/07/2001 Document Revised: 02/20/2018 Document Reviewed: 02/20/2018 Elsevier Patient Education  2020 [employer].

## 2019-04-30 NOTE — Progress Notes (Signed)
Flu shot received Nov. 2020 Mammo needs to be scheduled

## 2019-05-01 ENCOUNTER — Encounter: Payer: Self-pay | Admitting: Family Medicine

## 2019-05-01 DIAGNOSIS — N920 Excessive and frequent menstruation with regular cycle: Secondary | ICD-10-CM | POA: Insufficient documentation

## 2019-05-01 NOTE — Assessment & Plan Note (Signed)
Discussed Wellbutrin  will help mood, but not necessarily anxiety. Start low and slow, 1/2 tab per day x 5 days, then 1 q d x 5 d , then 1.5 twice daily x 5days, then 1 bid. May need to add for anxiety, Buspar or the like. May need increasing doses. Time of onset of effectiveness discussed.

## 2019-05-01 NOTE — Assessment & Plan Note (Signed)
Wellbutrin may help with this.

## 2019-05-01 NOTE — Assessment & Plan Note (Addendum)
Discussed options of IUD, oral meds, ablation, hysterectomy. Discussed risks of each. She will consider options and let me know her decision. She has had a salpingectomy.

## 2019-05-06 LAB — CYTOLOGY - PAP
Comment: NEGATIVE
Diagnosis: NEGATIVE
High risk HPV: NEGATIVE

## 2019-05-14 ENCOUNTER — Other Ambulatory Visit: Payer: Self-pay

## 2019-05-14 DIAGNOSIS — Z20822 Contact with and (suspected) exposure to covid-19: Secondary | ICD-10-CM

## 2019-05-16 LAB — NOVEL CORONAVIRUS, NAA: SARS-CoV-2, NAA: NOT DETECTED

## 2019-08-28 DIAGNOSIS — Z1152 Encounter for screening for COVID-19: Secondary | ICD-10-CM | POA: Diagnosis not present

## 2019-09-10 ENCOUNTER — Other Ambulatory Visit: Payer: Self-pay | Admitting: Family Medicine

## 2019-09-10 MED ORDER — PROMETHAZINE HCL 25 MG PO TABS: 25.0000 mg | ORAL_TABLET | Freq: Four times a day (QID) | ORAL | 2 refills | Status: DC | PRN

## 2019-09-10 MED ORDER — SCOPOLAMINE 1 MG/3DAYS TD PT72: 1.0000 | MEDICATED_PATCH | TRANSDERMAL | 0 refills | Status: AC

## 2019-09-10 NOTE — Progress Notes (Signed)
Gets motion sick and going on boat trip--rx sent in.

## 2019-09-15 ENCOUNTER — Ambulatory Visit
Admission: RE | Admit: 2019-09-15 | Discharge: 2019-09-15 | Disposition: A | Discharge status: Home / Self Care | Payer: BC Managed Care – PPO | Source: Ambulatory Visit | Attending: Family Medicine | Admitting: Family Medicine

## 2019-09-15 DIAGNOSIS — Z01419 Encounter for gynecological examination (general) (routine) without abnormal findings: Secondary | ICD-10-CM

## 2019-09-15 DIAGNOSIS — Z1231 Encounter for screening mammogram for malignant neoplasm of breast: Secondary | ICD-10-CM | POA: Diagnosis not present

## 2019-09-16 ENCOUNTER — Telehealth: Payer: Self-pay

## 2019-09-16 DIAGNOSIS — G4700 Insomnia, unspecified: Secondary | ICD-10-CM

## 2019-09-16 NOTE — Telephone Encounter (Signed)
Patient is requesting a refill on Xanax. She is going to the [place] for two weeks and will run out. She is requesting this medications be sent into [facility].

## 2019-09-17 ENCOUNTER — Other Ambulatory Visit: Payer: Self-pay | Admitting: Family Medicine

## 2019-09-17 DIAGNOSIS — G4700 Insomnia, unspecified: Secondary | ICD-10-CM

## 2019-09-17 MED ORDER — ALPRAZOLAM 0.5 MG PO TABS: ORAL_TABLET | ORAL | 2 refills | Status: DC

## 2019-12-24 ENCOUNTER — Other Ambulatory Visit: Payer: Self-pay | Admitting: Family Medicine

## 2019-12-24 DIAGNOSIS — G4700 Insomnia, unspecified: Secondary | ICD-10-CM

## 2020-03-07 ENCOUNTER — Encounter: Payer: Self-pay | Admitting: Radiology

## 2020-05-27 DIAGNOSIS — Z0189 Encounter for other specified special examinations: Secondary | ICD-10-CM | POA: Diagnosis not present

## 2020-05-30 DIAGNOSIS — Z043 Encounter for examination and observation following other accident: Secondary | ICD-10-CM | POA: Diagnosis not present

## 2020-05-30 DIAGNOSIS — Z713 Dietary counseling and surveillance: Secondary | ICD-10-CM | POA: Diagnosis not present

## 2020-07-15 ENCOUNTER — Other Ambulatory Visit: Payer: Self-pay | Admitting: Family Medicine

## 2020-07-15 DIAGNOSIS — G4700 Insomnia, unspecified: Secondary | ICD-10-CM

## 2020-08-10 ENCOUNTER — Other Ambulatory Visit (HOSPITAL_COMMUNITY)
Admission: RE | Admit: 2020-08-10 | Discharge: 2020-08-10 | Disposition: A | Discharge status: Home / Self Care | Payer: BC Managed Care – PPO | Source: Ambulatory Visit | Attending: Family Medicine | Admitting: Family Medicine

## 2020-08-10 ENCOUNTER — Encounter: Payer: Self-pay | Admitting: Family Medicine

## 2020-08-10 ENCOUNTER — Other Ambulatory Visit: Payer: Self-pay

## 2020-08-10 ENCOUNTER — Ambulatory Visit (INDEPENDENT_AMBULATORY_CARE_PROVIDER_SITE_OTHER): Payer: BC Managed Care – PPO | Admitting: Family Medicine

## 2020-08-10 DIAGNOSIS — Z124 Encounter for screening for malignant neoplasm of cervix: Secondary | ICD-10-CM | POA: Diagnosis not present

## 2020-08-10 DIAGNOSIS — Z01419 Encounter for gynecological examination (general) (routine) without abnormal findings: Secondary | ICD-10-CM | POA: Diagnosis not present

## 2020-08-10 NOTE — Progress Notes (Signed)
  Subjective:     Whitney Contreras is a 45 y.o. female and is here for a comprehensive physical exam. The patient reports no problems. Did not ever start her Wellbutrin. She is having a need for naps daily.  The following portions of the patient's history were reviewed and updated as appropriate: allergies, current medications, past family history, past medical history, past social history, past surgical history and problem list.  Review of Systems Pertinent items noted in HPI and remainder of comprehensive ROS otherwise negative.   Objective:    BP 117/75   Pulse 81   Ht 5\' 4"  (1.626 m)   Wt 147 lb 9.6 oz (67 kg)   LMP 01/19/2020 (Approximate)   BMI 25.34 kg/m  General appearance: alert, cooperative and appears stated age Head: Normocephalic, without obvious abnormality, atraumatic Neck: no adenopathy, supple, symmetrical, trachea midline and thyroid not enlarged, symmetric, no tenderness/mass/nodules Lungs: clear to auscultation bilaterally Breasts: normal appearance, no masses or tenderness Heart: regular rate and rhythm, S1, S2 normal, no murmur, click, rub or gallop Abdomen: soft, non-tender; bowel sounds normal; no masses,  no organomegaly Pelvic: cervix normal in appearance, external genitalia normal, no adnexal masses or tenderness, no cervical motion tenderness, uterus normal size, shape, and consistency and vagina normal without discharge Extremities: extremities normal, atraumatic, no cyanosis or edema Pulses: 2+ and symmetric Skin: Skin color, texture, turgor normal. No rashes or lesions Lymph nodes: Cervical, supraclavicular, and axillary nodes normal. Neurologic: Grossly normal    Assessment:    Healthy female exam.      Plan:   Problem List Items Addressed This Visit   None   Visit Diagnoses    Screening for malignant neoplasm of cervix       Relevant Orders   Cytology - PAP   Encounter for gynecological examination without abnormal finding       Relevant  Orders   MM 3D SCREEN BREAST BILATERAL     Return in 1 year (on 08/10/2021). To find PCP   See After Visit Summary for Counseling Recommendations

## 2020-08-10 NOTE — Patient Instructions (Signed)
Preventive Care 11-46 Years Old, Female Preventive care refers to lifestyle choices and visits with your health care provider that can promote health and wellness. This includes:  A yearly physical exam. This is also called an annual wellness visit.  Regular dental and eye exams.  Immunizations.  Screening for certain conditions.  Healthy lifestyle choices, such as: ? Eating a healthy diet. ? Getting regular exercise. ? Not using drugs or products that contain nicotine and tobacco. ? Limiting alcohol use. What can I expect for my preventive care visit? Physical exam Your health care provider may check your:  Height and weight. These may be used to calculate your BMI (body mass index). BMI is a measurement that tells if you are at a healthy weight.  Heart rate and blood pressure.  Body temperature.  Skin for abnormal spots. Counseling Your health care provider may ask you questions about your:  Past medical problems.  Family's medical history.  Alcohol, tobacco, and drug use.  Emotional well-being.  Home life and relationship well-being.  Sexual activity.  Diet, exercise, and sleep habits.  Work and work Statistician.  Access to firearms.  Method of birth control.  Menstrual cycle.  Pregnancy history. What immunizations do I need? Vaccines are usually given at various ages, according to a schedule. Your health care provider will recommend vaccines for you based on your age, medical history, and lifestyle or other factors, such as travel or where you work.   What tests do I need? Blood tests  Lipid and cholesterol levels. These may be checked every 5 years starting at age 64.  Hepatitis C test.  Hepatitis B test. Screening  Diabetes screening. This is done by checking your blood sugar (glucose) after you have not eaten for a while (fasting).  STD (sexually transmitted disease) testing, if you are at risk.  BRCA-related cancer screening. This may  be done if you have a family history of breast, ovarian, tubal, or peritoneal cancers.  Pelvic exam and Pap test. This may be done every 3 years starting at age 61. Starting at age 82, this may be done every 5 years if you have a Pap test in combination with an HPV test. Talk with your health care provider about your test results, treatment options, and if necessary, the need for more tests.   Follow these instructions at home: Eating and drinking  Eat a healthy diet that includes fresh fruits and vegetables, whole grains, lean protein, and low-fat dairy products.  Take vitamin and mineral supplements as recommended by your health care provider.  Do not drink alcohol if: ? Your health care provider tells you not to drink. ? You are pregnant, may be pregnant, or are planning to become pregnant.  If you drink alcohol: ? Limit how much you have to 0-1 drink a day. ? Be aware of how much alcohol is in your drink. In the [place] one drink equals one 12 oz bottle of beer (355 mL), one 5 oz glass of wine (148 mL), or one 1 oz glass of hard liquor (44 mL).   Lifestyle  Take daily care of your teeth and gums. Brush your teeth every morning and night with fluoride toothpaste. Floss one time each day.  Stay active. Exercise for at least 30 minutes 5 or more days each week.  Do not use any products that contain nicotine or tobacco, such as cigarettes, e-cigarettes, and chewing tobacco. If you need help quitting, ask your health care provider.  Do  not use drugs.  If you are sexually active, practice safe sex. Use a condom or other form of protection to prevent STIs (sexually transmitted infections).  If you do not wish to become pregnant, use a form of birth control. If you plan to become pregnant, see your health care provider for a prepregnancy visit.  Find healthy ways to cope with stress, such as: ? Meditation, yoga, or listening to music. ? Journaling. ? Talking to a trusted  person. ? Spending time with friends and family. Safety  Always wear your seat belt while driving or riding in a vehicle.  Do not drive: ? If you have been drinking alcohol. Do not ride with someone who has been drinking. ? When you are tired or distracted. ? While texting.  Wear a helmet and other protective equipment during sports activities.  If you have firearms in your house, make sure you follow all gun safety procedures.  Seek help if you have been physically or sexually abused. What's next?  Go to your health care provider once a year for an annual wellness visit.  Ask your health care provider how often you should have your eyes and teeth checked.  Stay up to date on all vaccines. This information is not intended to replace advice given to you by your health care provider. Make sure you discuss any questions you have with your health care provider. Document Revised: 02/07/2020 Document Reviewed: 02/20/2018 Elsevier Patient Education  2021 [employer].

## 2020-08-12 LAB — CYTOLOGY - PAP
Comment: NEGATIVE
Diagnosis: NEGATIVE
High risk HPV: NEGATIVE

## 2020-11-09 ENCOUNTER — Other Ambulatory Visit: Payer: Self-pay | Admitting: Family Medicine

## 2020-11-09 DIAGNOSIS — G4700 Insomnia, unspecified: Secondary | ICD-10-CM

## 2020-11-14 DIAGNOSIS — Z0189 Encounter for other specified special examinations: Secondary | ICD-10-CM | POA: Diagnosis not present

## 2020-11-23 DIAGNOSIS — L700 Acne vulgaris: Secondary | ICD-10-CM | POA: Diagnosis not present

## 2020-11-23 DIAGNOSIS — L821 Other seborrheic keratosis: Secondary | ICD-10-CM | POA: Diagnosis not present

## 2021-02-10 DIAGNOSIS — L821 Other seborrheic keratosis: Secondary | ICD-10-CM | POA: Diagnosis not present

## 2021-02-10 DIAGNOSIS — L700 Acne vulgaris: Secondary | ICD-10-CM | POA: Diagnosis not present

## 2021-03-02 ENCOUNTER — Ambulatory Visit: Payer: BC Managed Care – PPO | Admitting: Dermatology

## 2021-03-07 ENCOUNTER — Ambulatory Visit: Payer: BC Managed Care – PPO | Admitting: Dermatology

## 2021-04-26 DIAGNOSIS — Z713 Dietary counseling and surveillance: Secondary | ICD-10-CM | POA: Diagnosis not present

## 2021-04-26 DIAGNOSIS — Z043 Encounter for examination and observation following other accident: Secondary | ICD-10-CM | POA: Diagnosis not present

## 2021-05-15 ENCOUNTER — Other Ambulatory Visit: Payer: Self-pay | Admitting: Family Medicine

## 2021-05-15 DIAGNOSIS — G4700 Insomnia, unspecified: Secondary | ICD-10-CM

## 2021-05-29 DIAGNOSIS — K120 Recurrent oral aphthae: Secondary | ICD-10-CM | POA: Diagnosis not present

## 2021-06-20 ENCOUNTER — Encounter: Payer: Self-pay | Admitting: Radiology

## 2021-06-30 DIAGNOSIS — L700 Acne vulgaris: Secondary | ICD-10-CM | POA: Diagnosis not present

## 2021-07-14 ENCOUNTER — Other Ambulatory Visit: Payer: Self-pay | Admitting: Family Medicine

## 2021-07-14 DIAGNOSIS — G4700 Insomnia, unspecified: Secondary | ICD-10-CM

## 2021-07-27 ENCOUNTER — Other Ambulatory Visit: Payer: Self-pay | Admitting: *Deleted

## 2021-07-27 DIAGNOSIS — Z1231 Encounter for screening mammogram for malignant neoplasm of breast: Secondary | ICD-10-CM

## 2021-08-07 DIAGNOSIS — R21 Rash and other nonspecific skin eruption: Secondary | ICD-10-CM | POA: Diagnosis not present

## 2021-08-30 ENCOUNTER — Other Ambulatory Visit: Payer: Self-pay | Admitting: Family Medicine

## 2021-08-30 ENCOUNTER — Ambulatory Visit: Payer: BC Managed Care – PPO | Admitting: Family Medicine

## 2021-08-30 DIAGNOSIS — G4700 Insomnia, unspecified: Secondary | ICD-10-CM

## 2021-08-30 MED ORDER — ALPRAZOLAM 0.5 MG PO TABS: 0.5000 mg | ORAL_TABLET | Freq: Three times a day (TID) | ORAL | 3 refills | Status: DC | PRN

## 2021-09-15 ENCOUNTER — Other Ambulatory Visit: Payer: Self-pay | Admitting: Family Medicine

## 2021-09-15 ENCOUNTER — Ambulatory Visit
Admission: RE | Admit: 2021-09-15 | Discharge: 2021-09-15 | Disposition: A | Discharge status: Home / Self Care | Payer: BC Managed Care – PPO | Source: Ambulatory Visit | Attending: Family Medicine | Admitting: Family Medicine

## 2021-09-15 ENCOUNTER — Other Ambulatory Visit: Payer: Self-pay

## 2021-09-15 DIAGNOSIS — R928 Other abnormal and inconclusive findings on diagnostic imaging of breast: Secondary | ICD-10-CM

## 2021-09-15 DIAGNOSIS — Z1231 Encounter for screening mammogram for malignant neoplasm of breast: Secondary | ICD-10-CM | POA: Insufficient documentation

## 2021-09-15 DIAGNOSIS — N6489 Other specified disorders of breast: Secondary | ICD-10-CM

## 2021-09-28 DIAGNOSIS — R110 Nausea: Secondary | ICD-10-CM | POA: Diagnosis not present

## 2021-10-09 ENCOUNTER — Ambulatory Visit
Admission: RE | Admit: 2021-10-09 | Discharge: 2021-10-09 | Disposition: A | Discharge status: Home / Self Care | Payer: BC Managed Care – PPO | Source: Ambulatory Visit | Attending: Family Medicine | Admitting: Family Medicine

## 2021-10-09 DIAGNOSIS — R928 Other abnormal and inconclusive findings on diagnostic imaging of breast: Secondary | ICD-10-CM | POA: Diagnosis not present

## 2021-10-09 DIAGNOSIS — N6489 Other specified disorders of breast: Secondary | ICD-10-CM

## 2021-10-09 DIAGNOSIS — R922 Inconclusive mammogram: Secondary | ICD-10-CM | POA: Diagnosis not present

## 2021-11-01 ENCOUNTER — Ambulatory Visit (INDEPENDENT_AMBULATORY_CARE_PROVIDER_SITE_OTHER): Payer: BC Managed Care – PPO | Admitting: Family Medicine

## 2021-11-01 ENCOUNTER — Encounter: Payer: Self-pay | Admitting: Family Medicine

## 2021-11-01 VITALS — BP 107/75 | HR 88 | Ht 64.0 in | Wt 131.0 lb

## 2021-11-01 DIAGNOSIS — Z01419 Encounter for gynecological examination (general) (routine) without abnormal findings: Secondary | ICD-10-CM

## 2021-11-01 DIAGNOSIS — F411 Generalized anxiety disorder: Secondary | ICD-10-CM | POA: Diagnosis not present

## 2021-11-01 MED ORDER — ALPRAZOLAM 0.5 MG PO TABS: 0.5000 mg | ORAL_TABLET | Freq: Three times a day (TID) | ORAL | 3 refills | Status: DC | PRN

## 2021-11-01 MED ORDER — BUPROPION HCL ER (SR) 150 MG PO TB12: 150.0000 mg | ORAL_TABLET | Freq: Two times a day (BID) | ORAL | 2 refills | Status: AC

## 2021-11-01 NOTE — Assessment & Plan Note (Signed)
[id number] - discussed use of Wellbutrin, she would like to re-try this as it might help her focus more. Has tried in the past and had some jitteriness. Usual onset of action and side effects reviewed. ?

## 2021-11-01 NOTE — Progress Notes (Signed)
Subjective:  ?  ? Whitney Contreras is a 46 y.o. female and is here for a comprehensive physical exam. The patient reports no problems. Reports self-diagnosed ADD. Also wonders if her hormones are off. Reports regular cycles, but has brain fog. ? ? ?The following portions of the patient's history were reviewed and updated as appropriate: allergies, current medications, past family history, past medical history, past social history, past surgical history, and problem list. ? ?Review of Systems ?Pertinent items noted in HPI and remainder of comprehensive ROS otherwise negative.  ? ?Objective:  ? ? BP 107/75   Pulse 88   Ht '5\' 4"'$  (1.626 m)   Wt 131 lb (59.4 kg)   LMP 10/11/2021 (Approximate)   BMI 22.49 kg/m?  ?General appearance: alert, cooperative, and appears stated age ?Head: Normocephalic, without obvious abnormality, atraumatic ?Neck: no adenopathy, supple, symmetrical, trachea midline, and thyroid not enlarged, symmetric, no tenderness/mass/nodules ?Lungs: clear to auscultation bilaterally ?Breasts: normal appearance, no masses or tenderness ?Heart: regular rate and rhythm, S1, S2 normal, no murmur, click, rub or gallop ?Abdomen: soft, non-tender; bowel sounds normal; no masses,  no organomegaly ?Extremities: extremities normal, atraumatic, no cyanosis or edema ?Pulses: 2+ and symmetric ?Skin: Skin color, texture, turgor normal. No rashes or lesions ?Lymph nodes: Cervical, supraclavicular, and axillary nodes normal. ?Neurologic: Grossly normal  ?  ?Assessment:  ? ? Healthy female exam.    ?  ?Plan:  ? ?Problem List Items Addressed This Visit   ? ?  ? Unprioritized  ? Generalized anxiety disorder  ?  [phone] - discussed use of Wellbutrin, she would like to re-try this as it might help her focus more. Has tried in the past and had some jitteriness. Usual onset of action and side effects reviewed. ? ?  ?  ? Relevant Medications  ? ALPRAZolam (XANAX) 0.5 MG tablet  ? buPROPion (WELLBUTRIN SR) 150 MG 12 hr tablet   ? ?Other Visit Diagnoses   ? ? Encounter for gynecological examination without abnormal finding    -  Primary  ? [phone] Pap smear is up to date.  ? ?  ? ?Return in 1 year (on 11/02/2022). ? ?  ?See After Visit Summary for Counseling Recommendations  ? ?

## 2021-11-01 NOTE — Progress Notes (Signed)
Patient presents for Annual Exam today. ? ? ?Last Pap:08/10/2020 ?Last Mammogram:08/26/21 Diag 10/09/21 ?Contraception:no method ?STD Screening:No ?Family Hx of Breast Cancer:No ?Family Hx of Ovarian Cancer:No ? ?* Wants to discuss hormones Labs  ?

## 2022-03-29 ENCOUNTER — Ambulatory Visit: Payer: BC Managed Care – PPO | Admitting: Podiatry

## 2022-03-29 DIAGNOSIS — M7742 Metatarsalgia, left foot: Secondary | ICD-10-CM | POA: Diagnosis not present

## 2022-03-29 MED ORDER — MELOXICAM 15 MG PO TABS: 15.0000 mg | ORAL_TABLET | Freq: Every day | ORAL | 0 refills | Status: AC

## 2022-03-29 MED ORDER — METHYLPREDNISOLONE 4 MG PO TBPK: ORAL_TABLET | ORAL | 0 refills | Status: AC

## 2022-04-04 NOTE — Progress Notes (Signed)
Subjective:  Patient ID: Whitney Contreras, female    DOB: February 12, 1976,  MRN: [id number]  Chief Complaint  Patient presents with   Foot Pain    46 y.o. female presents with the above complaint.  Patient presents with complaint of left forefoot metatarsalgia pain.  Patient states been going for 3 weeks has progressive gotten worse.  She cannot walk without shoes it hurts in the ball of her foot.  She has to walk on the outside of the foot to compensate for it.  Pain scale 7 out of 10 hurts with ambulation hurts with pressure.  She has not seen anyone else prior to seeing me she denies any other acute complaints.   Review of Systems: Negative except as noted in the HPI. Denies N/V/F/Ch.  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   post delivery right leg   History of abnormal cervical Pap smear    HSV-1 infection    Valtrex PRN   Miscarriage    x2   PONV (postoperative nausea and vomiting)     Current Outpatient Medications:    meloxicam (MOBIC) 15 MG tablet, Take 1 tablet (15 mg total) by mouth daily., Disp: 30 tablet, Rfl: 0   methylPREDNISolone (MEDROL DOSEPAK) 4 MG TBPK tablet, Take as directed, Disp: 21 each, Rfl: 0   ALPRAZolam (XANAX) 0.5 MG tablet, Take 1 tablet (0.5 mg total) by mouth 3 (three) times daily as needed for anxiety., Disp: 32 tablet, Rfl: 3   buPROPion (WELLBUTRIN SR) 150 MG 12 hr tablet, Take 1 tablet (150 mg total) by mouth 2 (two) times daily., Disp: 90 tablet, Rfl: 2   scopolamine (TRANSDERM-SCOP, 1.5 MG,) 1 MG/3DAYS, Place 1 patch (1.5 mg total) onto the skin every 3 (three) days. (Patient not taking: Reported on 08/10/2020), Disp: 2 patch, Rfl: 0   valACYclovir (VALTREX) 500 MG tablet, Take 1 tablet (500 mg total) by mouth as needed., Disp: 30 tablet, Rfl: 11  Social History   Tobacco  Use  Smoking Status Former   Packs/day: 0.50   Years: 15.00   Total pack years: 7.50   Types: Cigarettes  Smokeless Tobacco Never    Allergies  Allergen Reactions   Vicodin [Hydrocodone-Acetaminophen] Nausea And Vomiting    lightheaded   Objective:  There were no vitals filed for this visit. There is no height or weight on file to calculate BMI. Constitutional Well developed. Well nourished.  Vascular Dorsalis pedis pulses palpable bilaterally. Posterior tibial pulses palpable bilaterally. Capillary refill normal to all digits.  No cyanosis or clubbing noted. Pedal hair growth normal.  Neurologic Normal speech. Oriented to person, place, and time. Epicritic sensation to light touch grossly present bilaterally.  Dermatologic Nails well groomed and normal in appearance. No open wounds. No skin lesions.  Orthopedic: Generalized forefoot pain noted without any localized pain of Mulder's click or neuroma symptoms.  No capsulitis pain noted.  Metatarsalgia noted to left forefoot.   Radiographs: None Assessment:   1. Metatarsalgia of left foot    Plan:  Patient was evaluated and treated and all questions answered.  Left foot metatarsalgia -All questions and concerns were discussed with the patient in extensive detail given the amount of pain that she is having I believe she will benefit from cam  boot immobilization to allow the soft tissue structure to heal appropriately.  Cam boot was dispensed.  At this time this will allow the pain to be more localized.  Will discuss injection during next clinical visit. -Medrol Dosepak and meloxicam were dispensed and sent to the pharmacy  No follow-ups on file.

## 2022-04-26 ENCOUNTER — Ambulatory Visit: Payer: BC Managed Care – PPO | Admitting: Podiatry

## 2022-04-26 DIAGNOSIS — M7742 Metatarsalgia, left foot: Secondary | ICD-10-CM

## 2022-04-26 DIAGNOSIS — Z043 Encounter for examination and observation following other accident: Secondary | ICD-10-CM | POA: Diagnosis not present

## 2022-04-26 DIAGNOSIS — Z713 Dietary counseling and surveillance: Secondary | ICD-10-CM | POA: Diagnosis not present

## 2022-04-26 NOTE — Progress Notes (Signed)
Subjective:  Patient ID: Whitney Contreras, female    DOB: October 05, 1975,  MRN: [id number]  Chief Complaint  Patient presents with   Foot Pain    "It's much better.  I want him to take a look at my second toe on my right foot. I got it twisted in a cord.  It's better but it's still swollen.  I don't think it's broken."    46 y.o. female presents with the above complaint.  Patient presents for follow-up to left forefoot metatarsalgia.  She states she is doing a lot better no further pain she denies any other acute complaints.   Review of Systems: Negative except as noted in the HPI. Denies N/V/F/Ch.  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   post delivery right leg   History of abnormal cervical Pap smear    HSV-1 infection    Valtrex PRN   Miscarriage    x2   PONV (postoperative nausea and vomiting)     Current Outpatient Medications:    ALPRAZolam (XANAX) 0.5 MG tablet, Take 1 tablet (0.5 mg total) by mouth 3 (three) times daily as needed for anxiety., Disp: 32 tablet, Rfl: 3   buPROPion (WELLBUTRIN SR) 150 MG 12 hr tablet, Take 1 tablet (150 mg total) by mouth 2 (two) times daily., Disp: 90 tablet, Rfl: 2   meloxicam (MOBIC) 15 MG tablet, Take 1 tablet (15 mg total) by mouth daily., Disp: 30 tablet, Rfl: 0   methylPREDNISolone (MEDROL DOSEPAK) 4 MG TBPK tablet, Take as directed, Disp: 21 each, Rfl: 0   scopolamine (TRANSDERM-SCOP, 1.5 MG,) 1 MG/3DAYS, Place 1 patch (1.5 mg total) onto the skin every 3 (three) days. (Patient not taking: Reported on 08/10/2020), Disp: 2 patch, Rfl: 0   valACYclovir (VALTREX) 500 MG tablet, Take 1 tablet (500 mg total) by mouth as needed., Disp: 30 tablet, Rfl: 11  Social History   Tobacco Use  Smoking Status Former   Packs/day: 0.50   Years: 15.00   Total pack years: 7.50    Types: Cigarettes  Smokeless Tobacco Never    Allergies  Allergen Reactions   Vicodin [Hydrocodone-Acetaminophen] Nausea And Vomiting    lightheaded   Objective:  There were no vitals filed for this visit. There is no height or weight on file to calculate BMI. Constitutional Well developed. Well nourished.  Vascular Dorsalis pedis pulses palpable bilaterally. Posterior tibial pulses palpable bilaterally. Capillary refill normal to all digits.  No cyanosis or clubbing noted. Pedal hair growth normal.  Neurologic Normal speech. Oriented to person, place, and time. Epicritic sensation to light touch grossly present bilaterally.  Dermatologic Nails well groomed and normal in appearance. No open wounds. No skin lesions.  Orthopedic: No further generalized forefoot pain noted without any localized pain of Mulder's click or neuroma symptoms.  No capsulitis pain noted.  Metatarsalgia noted to left forefoot.   Radiographs: None Assessment:   1. Metatarsalgia of left foot     Plan:  Patient was evaluated and treated and all questions answered.  Left foot metatarsalgia -Clinically she is healed and is doing much better.  She has transition to regular shoes.  I discussed shoe gear modification in extensive detail she states understanding if any foot and ankle issues on future advised  her to come back and see me.  No follow-ups on file.

## 2022-04-30 ENCOUNTER — Other Ambulatory Visit: Payer: Self-pay | Admitting: Family Medicine

## 2022-04-30 DIAGNOSIS — F411 Generalized anxiety disorder: Secondary | ICD-10-CM

## 2022-06-11 ENCOUNTER — Encounter: Payer: Self-pay | Admitting: Family Medicine

## 2022-06-12 ENCOUNTER — Other Ambulatory Visit: Payer: Self-pay | Admitting: Family Medicine

## 2022-06-12 DIAGNOSIS — F411 Generalized anxiety disorder: Secondary | ICD-10-CM

## 2022-06-12 MED ORDER — ALPRAZOLAM 0.5 MG PO TABS: 0.5000 mg | ORAL_TABLET | Freq: Three times a day (TID) | ORAL | 3 refills | Status: AC | PRN

## 2022-07-05 DIAGNOSIS — S91209A Unspecified open wound of unspecified toe(s) with damage to nail, initial encounter: Secondary | ICD-10-CM | POA: Diagnosis not present

## 2022-09-19 DIAGNOSIS — F411 Generalized anxiety disorder: Secondary | ICD-10-CM | POA: Diagnosis not present

## 2022-09-19 DIAGNOSIS — E559 Vitamin D deficiency, unspecified: Secondary | ICD-10-CM | POA: Diagnosis not present

## 2022-10-08 ENCOUNTER — Other Ambulatory Visit: Payer: Self-pay | Admitting: Family Medicine

## 2022-10-08 DIAGNOSIS — F411 Generalized anxiety disorder: Secondary | ICD-10-CM

## 2022-10-16 DIAGNOSIS — F411 Generalized anxiety disorder: Secondary | ICD-10-CM | POA: Diagnosis not present

## 2022-10-16 DIAGNOSIS — R6882 Decreased libido: Secondary | ICD-10-CM | POA: Diagnosis not present

## 2023-04-09 ENCOUNTER — Other Ambulatory Visit: Payer: Self-pay | Admitting: Family Medicine

## 2023-04-09 DIAGNOSIS — Z1231 Encounter for screening mammogram for malignant neoplasm of breast: Secondary | ICD-10-CM

## 2023-04-18 DIAGNOSIS — Z6825 Body mass index (BMI) 25.0-25.9, adult: Secondary | ICD-10-CM | POA: Diagnosis not present

## 2023-04-18 DIAGNOSIS — F411 Generalized anxiety disorder: Secondary | ICD-10-CM | POA: Diagnosis not present

## 2023-04-18 DIAGNOSIS — E538 Deficiency of other specified B group vitamins: Secondary | ICD-10-CM | POA: Diagnosis not present

## 2023-04-23 ENCOUNTER — Ambulatory Visit
Admission: RE | Admit: 2023-04-23 | Discharge: 2023-04-23 | Disposition: A | Discharge status: Home / Self Care | Payer: BC Managed Care – PPO | Source: Ambulatory Visit | Attending: Family Medicine | Admitting: Family Medicine

## 2023-04-23 DIAGNOSIS — Z1231 Encounter for screening mammogram for malignant neoplasm of breast: Secondary | ICD-10-CM | POA: Diagnosis not present

## 2024-07-06 ENCOUNTER — Encounter: Payer: Self-pay | Admitting: Family Medicine

## 2024-07-06 DIAGNOSIS — Z1231 Encounter for screening mammogram for malignant neoplasm of breast: Secondary | ICD-10-CM
